# Patient Record
Sex: Female | Born: 1950 | Race: White | Hispanic: No | Smoking: Former smoker
Health system: Southern US, Community
[De-identification: ages and names within clinical notes are randomized; demographics above are authoritative.]

## PROBLEM LIST (undated history)

## (undated) DIAGNOSIS — R7303 Prediabetes: Secondary | ICD-10-CM

## (undated) DIAGNOSIS — M1990 Unspecified osteoarthritis, unspecified site: Secondary | ICD-10-CM

## (undated) DIAGNOSIS — F419 Anxiety disorder, unspecified: Secondary | ICD-10-CM

## (undated) DIAGNOSIS — M329 Systemic lupus erythematosus, unspecified: Secondary | ICD-10-CM

## (undated) DIAGNOSIS — E039 Hypothyroidism, unspecified: Secondary | ICD-10-CM

## (undated) DIAGNOSIS — F32A Depression, unspecified: Secondary | ICD-10-CM

## (undated) HISTORY — DX: Depression, unspecified: F32.A

## (undated) HISTORY — DX: Prediabetes: R73.03

## (undated) HISTORY — PX: TRIGGER FINGER RELEASE: SHX641

## (undated) HISTORY — DX: Systemic lupus erythematosus, unspecified: M32.9

## (undated) HISTORY — DX: Unspecified osteoarthritis, unspecified site: M19.90

## (undated) HISTORY — PX: JOINT REPLACEMENT: SHX530

## (undated) HISTORY — DX: Hypothyroidism, unspecified: E03.9

## (undated) HISTORY — DX: Anxiety disorder, unspecified: F41.9

---
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
## 1982-06-11 HISTORY — PX: OTHER SURGICAL HISTORY: SHX169

## 1986-06-11 HISTORY — PX: DE QUERVAIN'S RELEASE: SHX1439

## 1994-02-20 ENCOUNTER — Ambulatory Visit: Admit: 1994-02-20 | Disposition: A | Discharge status: Home / Self Care | Payer: Self-pay

## 1994-06-28 ENCOUNTER — Ambulatory Visit
Admit: 1994-06-28 | Disposition: A | Discharge status: Home / Self Care | Payer: Self-pay | Admitting: Emergency Medicine

## 1995-01-23 ENCOUNTER — Ambulatory Visit
Admit: 1995-01-23 | Disposition: A | Discharge status: Home / Self Care | Payer: Self-pay | Admitting: Occupational Medicine

## 1995-06-08 ENCOUNTER — Ambulatory Visit
Admit: 1995-06-08 | Disposition: A | Discharge status: Home / Self Care | Payer: Self-pay | Admitting: Occupational Medicine

## 1995-09-04 ENCOUNTER — Ambulatory Visit
Admit: 1995-09-04 | Disposition: A | Discharge status: Home / Self Care | Payer: Self-pay | Admitting: Occupational Medicine

## 1996-02-29 ENCOUNTER — Ambulatory Visit: Admit: 1996-02-29 | Disposition: A | Discharge status: Home / Self Care | Payer: Self-pay

## 1996-10-25 ENCOUNTER — Ambulatory Visit: Admit: 1996-10-25 | Disposition: A | Discharge status: Home / Self Care | Payer: Self-pay | Admitting: Family Medicine

## 2000-02-01 ENCOUNTER — Ambulatory Visit: Admit: 2000-02-01 | Disposition: A | Discharge status: Home / Self Care | Payer: Self-pay

## 2005-06-22 ENCOUNTER — Ambulatory Visit
Admission: RE | Admit: 2005-06-22 | Disposition: A | Discharge status: Home / Self Care | Payer: Self-pay | Source: Ambulatory Visit

## 2009-12-31 ENCOUNTER — Emergency Department
Admission: EM | Admit: 2009-12-31 | Disposition: A | Discharge status: Home / Self Care | Payer: Self-pay | Source: Emergency Department | Admitting: Emergency Medicine

## 2010-06-16 ENCOUNTER — Ambulatory Visit
Admission: AD | Admit: 2010-06-16 | Disposition: A | Discharge status: Home / Self Care | Payer: Self-pay | Source: Ambulatory Visit | Admitting: Orthopaedic Surgery

## 2010-09-11 ENCOUNTER — Inpatient Hospital Stay
Admission: EM | Admit: 2010-09-11 | Disposition: A | Discharge status: Home Health | Payer: Self-pay | Source: Emergency Department

## 2010-09-11 LAB — SEDIMENTATION RATE, AUTOMATED: Sed Rate: 21 (ref 0–30)

## 2010-09-11 LAB — URINALYSIS
Bilirubin, UA: NEGATIVE
Blood, UA: NEGATIVE
Glucose, UA: NEGATIVE
Ketones UA: NEGATIVE
Leukocyte Esterase, UA: NEGATIVE
Nitrate: NEGATIVE
Protein, UR: NEGATIVE
Specific Gravity, UR: 1.011 (ref 1.000–1.035)
Urobilinogen, UA: NORMAL
pH, Urine: 6 (ref 5.0–8.0)

## 2010-09-11 LAB — COMPREHENSIVE METABOLIC PANEL
ALT: 41 U/L (ref 7–56)
AST (SGOT): 36 U/L (ref 5–40)
Albumin, Synovial: 4.2 g/dL (ref 3.9–5.0)
Alkaline Phosphatase: 112 U/L (ref 38–126)
BUN / Creatinine Ratio: 17 (ref 8–20)
BUN: 14 mg/dL (ref 6–20)
Bilirubin, Total: 0.5 mg/dL (ref 0.2–1.3)
CO2: 25 mmol/L (ref 21.0–31.0)
Calcium: 9.2 mg/dL (ref 8.4–10.2)
Chloride: 104 mmol/L (ref 101–111)
Creatinine: 0.82 mg/dL (ref 0.52–1.04)
EGFR: >60 mL/min/{1.73_m2}
EGFR: >60 mL/min/{1.73_m2}
Glucose: 100 mg/dL (ref 70–100)
Potassium: 4.6 mmol/L (ref 3.6–5.0)
Protein, Total: 7.5 g/dL (ref 6.3–8.2)
Sodium: 138 mmol/L (ref 135–145)

## 2010-09-11 LAB — PTT (PARTIAL THROMBOPLASTIN TIME): PTT: 25.9 s (ref 21.5–36.5)

## 2010-09-11 LAB — CBC AND DIFFERENTIAL
BASOPHILS %: 0.6 % (ref 0.0–2.0)
Baso(Absolute): 0.05 10*3/uL (ref 0.00–0.20)
Eosinophils %: 4.3 % (ref 0.0–6.0)
Eosinophils Absolute: 0.34 10*3/uL — ABNORMAL HIGH (ref 0.10–0.30)
Hematocrit: 41.1 % (ref 27.0–49.5)
Hemoglobin: 13.9 g/dL (ref 11.7–15.5)
Immature Granulocytes #: 0.02 10*3/uL (ref 0.00–0.05)
Immature Granulocytes %: 0.3 % — ABNORMAL HIGH (ref 0.0–0.0)
Lymphocytes Absolute: 2.12 10*3/uL (ref 1.00–4.80)
Lymphocytes Relative: 27 % (ref 25.0–55.0)
MCH: 31.8 pg (ref 27.0–34.0)
MCHC: 33.8 g/dL (ref 32.0–36.0)
MCV: 94.1 fL (ref 80–100)
MPV: 9.3 fL (ref 9.0–13.0)
Monocytes Absolute: 0.65 10*3/uL (ref 0.10–1.20)
Monocytes Relative %: 8.3 % — ABNORMAL HIGH (ref 1.0–8.0)
Neutrophils Absolute: 4.7 10*3/uL (ref 1.80–7.70)
Neutrophils Relative %: 59.8 % (ref 49.0–69.0)
Nucleated RBC %: 0 /100WBC (ref 0.0–0.0)
Nucleted RBC #: 0 10*3/uL (ref 0.00–0.00)
Platelets: 285 10*3/uL (ref 150–400)
RBC: 4.37 M/uL (ref 3.80–5.40)
RDW: 12.9 % (ref 11.0–14.0)
WBC: 7.86 10*3/uL (ref 4.80–10.80)

## 2010-09-11 LAB — PT (PROTHROMBIN TIME)
PT INR: 0.9
PT: 11.5 s — ABNORMAL LOW (ref 11.6–14.1)

## 2010-09-11 LAB — C-REACTIVE PROTEIN, QUANTITATIVE: C-Reactive Protein, Quantitative: 1.1 mg/dL — ABNORMAL HIGH (ref 0.0–1.0)

## 2010-09-12 LAB — CBC AND DIFFERENTIAL
BASOPHILS %: 0.5 % (ref 0.0–2.0)
Baso(Absolute): 0.03 10*3/uL (ref 0.00–0.20)
Eosinophils %: 5.1 % (ref 0.0–6.0)
Eosinophils Absolute: 0.31 10*3/uL — ABNORMAL HIGH (ref 0.10–0.30)
Hematocrit: 36.5 % (ref 27.0–49.5)
Hemoglobin: 12 g/dL (ref 11.7–15.5)
Immature Granulocytes #: 0.01 10*3/uL (ref 0.00–0.05)
Immature Granulocytes %: 0.2 % — ABNORMAL HIGH (ref 0.0–0.0)
Lymphocytes Absolute: 2.13 10*3/uL (ref 1.00–4.80)
Lymphocytes Relative: 34.9 % (ref 25.0–55.0)
MCH: 31.5 pg (ref 27.0–34.0)
MCHC: 32.9 g/dL (ref 32.0–36.0)
MCV: 95.8 fL (ref 80–100)
MPV: 8.9 fL — ABNORMAL LOW (ref 9.0–13.0)
Monocytes Absolute: 0.61 10*3/uL (ref 0.10–1.20)
Monocytes Relative %: 10 % — ABNORMAL HIGH (ref 1.0–8.0)
Neutrophils Absolute: 3.03 10*3/uL (ref 1.80–7.70)
Neutrophils Relative %: 49.5 % (ref 49.0–69.0)
Nucleated RBC %: 0 /100WBC (ref 0.0–0.0)
Nucleted RBC #: 0 10*3/uL (ref 0.00–0.00)
Platelets: 231 10*3/uL (ref 150–400)
RBC: 3.81 M/uL (ref 3.80–5.40)
RDW: 13 % (ref 11.0–14.0)
WBC: 6.11 10*3/uL (ref 4.80–10.80)

## 2010-09-12 LAB — COMPREHENSIVE METABOLIC PANEL
ALT: 37 U/L (ref 7–56)
AST (SGOT): 28 U/L (ref 5–40)
Albumin, Synovial: 2.9 g/dL — ABNORMAL LOW (ref 3.9–5.0)
Alkaline Phosphatase: 69 U/L (ref 38–126)
BUN / Creatinine Ratio: 16 (ref 8–20)
BUN: 13 mg/dL (ref 6–20)
Bilirubin, Total: 0.3 mg/dL (ref 0.2–1.3)
CO2: 27 mmol/L (ref 21.0–31.0)
Calcium: 8.2 mg/dL — ABNORMAL LOW (ref 8.4–10.2)
Chloride: 109 mmol/L (ref 101–111)
Creatinine: 0.86 mg/dL (ref 0.52–1.04)
EGFR: >60 mL/min/{1.73_m2}
EGFR: >60 mL/min/{1.73_m2}
Glucose: 93 mg/dL (ref 70–100)
Potassium: 4.8 mmol/L (ref 3.6–5.0)
Protein, Total: 5.6 g/dL — ABNORMAL LOW (ref 6.3–8.2)
Sodium: 140 mmol/L (ref 135–145)

## 2010-09-13 LAB — CBC
Hematocrit: 34.2 % (ref 27.0–49.5)
Hemoglobin: 11.3 g/dL — ABNORMAL LOW (ref 11.7–15.5)
MCH: 31.3 pg (ref 27.0–34.0)
MCHC: 33 g/dL (ref 32.0–36.0)
MCV: 94.7 fL (ref 80–100)
MPV: 9 fL (ref 9.0–13.0)
Nucleated RBC %: 0 /100WBC (ref 0.0–0.0)
Nucleted RBC #: 0 10*3/uL (ref 0.00–0.00)
Platelets: 228 10*3/uL (ref 150–400)
RBC: 3.61 M/uL — ABNORMAL LOW (ref 3.80–5.40)
RDW: 12.8 % (ref 11.0–14.0)
WBC: 5.96 10*3/uL (ref 4.80–10.80)

## 2010-09-13 LAB — VANCOMYCIN TROUGH: Vancomycin Trough: 10.5 ug/mL (ref 10.0–20.0)

## 2010-09-14 LAB — FUNGAL STAIN: Calcofluor White Stain Result: NEGATIVE

## 2010-10-14 LAB — FUNGAL CULTURE AND SMEAR

## 2011-03-14 NOTE — Consults (Signed)
Lori, Arnold                                                    MRN:          [id number]                                                          Account:      [id number]                                                     Document ID:  [id number]                                               Service Date: 09/11/2010                                                                                    MRN: [id number]  Document ID: [id number]  Admit Date: 09/11/2010     Patient Location: [id number]   Patient Type: I     CONSULTING PHYSICIAN: ANTONIO PASTOR MD     REFERRING PHYSICIAN: Earline Mayotte MD        REASON FOR CONSULTATION:  Antibiotic recommendations.     HISTORY OF PRESENT ILLNESS:  This is a very pleasant 60 year old female with a history of depression,  hypothyroidism, who on June 02, 2010 underwent surgery for a trigger  finger in the left hand.  The surgery went uneventfully.  Then about 2  weeks later, she went to see Dr. Allen Norris to remove the stitches, which were  removed, and within 1 day, there was oozing from the wound.  The patient  was taken to surgery on June 16, 2010, for right hand incision and  drainage and found to have a right-hand ring finger infectious  tenosynovitis.  The infection was cleaned out.  She received Rocephin and  vancomycin, and then was sent home on antibiotics, which she took totally  for 20 days and did fine up until a couple of days ago, when she started  noticing discomfort in the ring finger, and the palm of the hand started to  become a little bit tense and swollen.  There is some redness in the palm  of the hand just below the base of the ring finger, this is like a bulging  area.  She has had no symptoms of fever or chills, but discomfort in the  area, and pain and swelling of the right palm into the ring finger.  She  was  given Bactrim.  She went to urgent care for 2 days without improvement.     PAST MEDICAL HISTORY:  Hypothyroidism, _____, trigger finger  surgery.     PAST SURGICAL HISTORY:  Carpal tunnel, history of release of trigger finger of the ring finger of  the right hand.     SOCIAL HISTORY:  Consumes alcohol socially.  Denies tobacco abuse.  She does have a history  of depression.     PHYSICAL EXAMINATION:                                                                                                           Page 1 of 3  Lori, Arnold                                                    MRN:          [id number]                                                          Account:      [id number]                                                     Document ID:  [id number]                                               Service Date: 09/11/2010                                                                                    GENERAL:  She is awake, alert.  VITAL SIGNS:  Temperature 98.2, heart rate is 82, respiratory rate 14,  blood pressure is 163/87.  HEENT:  Head atraumatic, normocephalic.  Eyes:  PERRLA.  Ears:  Normal.   Nose:  Normal.  Throat not congested.  No exudates.  No evidence of thrush.  NECK:  Supple.  Full range of motion.  Carotid pulses are 2+.  No JVD,  nodes or bruits.  LUNGS:  Clear to auscultation.  CARDIOVASCULAR:  Normal S1, S2, regular  rate and rhythm.  No murmurs,  gallops or rubs.  ABDOMEN:  Bowel sounds are present.  Depressible.  Nontender to palpation.   There is no hepatosplenomegaly.  EXTREMITIES:  Pulses are 2+.  The palm of the right hand has edema, and  there is bulging area in the base of the palm going into the ring finger,  it is a little bit erythematous, but I do not see an open wound.  NEUROLOGIC:  Alert and oriented x3.  No signs of focalization.     DIAGNOSTIC STUDIES:  White count is 7.86; hemoglobin 13.9; hematocrit 41.1; platelets of 285.   C-reactive protein is 1.1.  Sodium is 138, potassium 4.6, chloride 104, CO2  25, BUN 14, creatinine 0.82, glucose 100.  Sedimentation rate is 21.  X-ray  of the right hand  reveals no acute bony abnormality.     ASSESSMENT:  This 60 year old female with a history of right trigger finger release in  late December of last year, complicated with an infected tenosynovitis,  treated with I and D and p.o. antibiotics for 20 days, now comes in with  swelling and some redness and pain of the right hand in the palm of the  hand and the base of the right ring finger.  Definitely an infectious  tenosynovitis needs to be ruled out.  My recommendation at this point will  be to continue vancomycin and Zosyn.  I will discuss with Dr. Allen Norris about  doing an MRI.  The patient may require surgery, and if she truly has an  infectious tenosynovitis, my recommendation would be to do IV antibiotics.         Thank you for the consultation.              D:  09/11/2010 10:36 AM by Gerilyn Pilgrim, MD [phone])  T:  09/11/2010 20:23 PM by Elroy Channel: [id number]) (Doc ID: [id number])                                                                                                                 Page 2 of 3  Lori, Arnold                                                    MRN:          [id number]                                                          Account:      [id number]  Document ID:  [id number]                                               Service Date: 09/11/2010                                                                                    cc: Barbera Setters MD                                                                                                           Page 3 of 3  Authenticated by Gerilyn Pilgrim, MD On 09/28/2010 07:58:00 PM

## 2011-03-14 NOTE — Discharge Summary (Signed)
PIERCE, BIAGINI                                                    MRN:          [id number]                                                          Account:      [id number]                                                     Document ID:  [id number]                                                                                                                                   MRN: [id number]  Document ID: [id number]  Admit Date: 09/11/2010  Discharge Date: 09/13/2010     ATTENDING PHYSICIAN:  MOHAMED ALI, MD        DISCHARGE DIAGNOSES:  1.  Right index finger abscess/cellulitis status post incision and  drainage.  2.  History of right index finger infectious tenosynovitis.  3.  History of hypothyroidism.  4.  History of depression.     CONSULTATIONS:  Dr. Allen Norris, orthopedic surgery.    Dr. Gerilyn Pilgrim, infectious diseases.     HOSPITAL COURSE:  This is a 60 year old woman with a recent history of a right index finger  trigger finger, status post incision and drainage for infectious  tenosynovitis in January 2012, and subsequently the infection was cleared.   She received Rocephin and vancomycin, and was sent home on antibiotics.   She took the antibiotics for 20 days and did fine until 2 weeks ago when  she started noticing discomfort in the right finger and the palm of the  hand started to become tense and swollen, and redness in the palm continued  to worsen.  However, she denied fever or chills.  Therefore, the patient  came to the emergency room for evaluation of the right finger and the palm  swelling and redness.  Before coming to the emergency room, 2 days ago the  patient went to urgent care center and was prescribed Bactrim.  However,  with no improvement in the right hand redness or swelling.  Orthopedic  consultation was obtained and per Dr. Allen Norris to continue with antibiotics  if agreed by infectious diseases consultation.  Infectious diseases  consultation was obtained and  recommendations were to  start on vancomycin  and Zosyn, and MRI of the hand as well.  MRI of the right hand showed focal  rim enhancing fluid collection along the ulnar aspect of the ring finger  flexor tendon at the level of the metatarsophalangeal joint that appeared  contiguous with flexor tendon sheath, likely representing an abscess or  focal infectious tenosynovitis with dilatation of the tendon sheath at the  site of prior surgical operation.  Given in information from MRI,  orthopedic consultation was obtained and  Dr. Allen Norris recommended incision  and drainage of the right hand for abscess.  The patient consented to the  surgical operation.  The patient underwent the incision and drainage of the  right hand for abscess drainage.  The patient tolerated the procedure well,                                                                                                           Page 1 of 2  JOZEY, JANCO                                                    MRN:          [id number]                                                          Account:      [id number]                                                     Document ID:  [id number]                                                                                                                                   according to the operative note.  Vancomycin and Zosyn was continued.  Per  infectious diseases consultation, the patient can be discharged to home  with IV antibiotics at home to be arranged by case  management to follow up  with Dr. Gerilyn Pilgrim in 10 days, the infectious diseases clinic, and  also to follow up with Dr. Allen Norris one day after the discharge on September 14, 2010, at the orthopedic surgery clinic.  The patient agrees for followup  with infectious disease clinic and orthopedic surgery clinic. The patient  agrees for administration of IV antibiotics with the help of health, home  health aide.  Currently, the patient is afebrile.  hemodynamically stable  and  is ready for discharge.     CONDITION UPON DISCHARGE:  Hemodynamically stable.     DISPOSITION:  Discharged to home with the arrangement for IV antibiotic infusion at home.     DISCHARGE MEDICATIONS:  Please see medical reconciliation form for discharge medications.     FOLLOWUP INSTRUCTIONS:  Follow up with Dr. Allen Norris on September 14, 2010, in orthopedic surgery clinic.      Follow up with Dr. Gerilyn Pilgrim in 10 days and the infectious diseases  clinic.    Follow up with primary care physician in 1 week.       Time spent to discharge the patient is 33 minutes.              _______________________________     Date/Time Signed: _____________  Judson Roch MD ([id number])     D:  09/13/2010 14:42 PM by Dr. Judson Roch, MD ([id number])  T:  09/14/2010 15:04 PM by Lynnell Grain      [name]: [id number]) (Doc ID: [id number])           cc: Barbera Setters MD   Zoe Lan PASTOR MD                                                                                                           Page 2 of 2  Authenticated by Judson Roch, MD On 09/20/2010 07:18:35 AM

## 2011-03-14 NOTE — H&P (Signed)
Lori Arnold, Lori Arnold                                                    MRN:          [id number]                                                          Account:      [id number]                                                     Document ID:  [id number]                                                                                                                                   MRN: [id number]  Document ID: [id number]  Admit Date: 09/11/2010     Patient Location: [id number]   Patient Type: I     ATTENDING PHYSICIAN: MOHAMED ALI, MD        CHIEF COMPLAINT:  Right hand pain.     HISTORY OF PRESENT ILLNESS:  This is a 60 year old female who developed right ring finger infectious  flexor tenosynovitis after she had surgery for trigger finger on June 02, 2010.  She underwent incision and drainage on January 6 for the  treatment of the infectious tenosynovitis.  The patient was on antibiotics  for 3 weeks, after which she felt better and she was asymptomatic for the  last 2 months.  Currently, she presents to the emergency room for  evaluation of progressive swelling, erythema, and tenderness that started  under ring finger and spread to her hand, leading to restricted joint  movement and malfunction.  She denied any fever, malaise, or weakness.  No  recent trauma to the hand and no discharge.  The orthopedic surgeon  on-call, Dr. Kellie Shropshire, was contacted and he recommended calling the orthopedic  surgeon who performed the procedure, Dr. Allen Norris.  Also, he recommended no  antibiotics until an infectious disease specialist  was consulted.  ID  recommended starting the patient on vancomycin, and Zosyn pending  orthopedic surgeon's evaluation.     PAST MEDICAL HISTORY:  Hypothyroidism.     PAST SURGICAL HISTORY  Trigger finger incision and drainage for tenosynovitis.     PAST PSYCHIATRIC HISTORY:  Depression.     SOCIAL HISTORY:  Consumes alcohol socially.  Denies tobacco and drug abuse.  ALLERGIES:  No known drug  allergies.                                                                                                              Page 1 of 3  Lori, Arnold                                                    MRN:          [id number]                                                          Account:      [id number]                                                     Document ID:  [id number]                                                                                                                                   CURRENT MEDICATIONS:  Ibuprofen 800 mg orally q.8 h., Wellbutrin 50 mg orally daily,  levothyroxine, and Cymbalta.     PHYSICAL EXAMINATION:  VITAL SIGNS:  On presentation, blood pressure 166/83, heart rate 84,  respiratory rate 18, temperature 98.2, oxygen saturation 99% on room air.  GENERAL:  She is a middle-aged female, alert, oriented, not in respiratory  distress or pain.  HEENT:  Not pale, not icteric, not cyanosed.  Moist mucous membranes.  NECK:  No JVD, no carotid bruit.  CARDIOVASCULAR:  S1, S2 are normal.  No murmurs or added sounds.  CHEST:  Clear to auscultation bilaterally.  ABDOMEN:  Soft, nontender, nondistended.  EXTREMITIES:  No edema, no rash on right.  She has right fingers swelling,  erythema associated with palmar swelling and erythema, more pronounced at  the metacarpophalangeal area.  Old surgical scar, no evidence of discharge.   Supratrochlear tenderness.     DIAGNOSTIC STUDIES:  Hand  x-ray:  No acute bony abnormality.  White blood cell 7.8, hemoglobin  13.9, hematocrit 41.1, platelets 285.  BUN 14, sodium 138, potassium 4.6,  chloride 104, CO2 25, glucose 100, creatinine 0.8, calcium 9.2, bilirubin  0.5, protein 7.5.  C-reactive protein 1.1.     ASSESSMENT AND PLAN:  1.  Cellulitis/abscess with hand space infection.  2.  History of right ring finger infectious flexor tenosynovitis, status  post incision and drainage on June 16, 2010.  Subsequently, she was on  antibiotics for 3  weeks.   3.  History of trigger finger surgery on June 02, 2010.  4.  Hypothyroidism.  5.  Depression.     This is a 60 year old female with the above-mentioned history who presents  to the emergency room for evaluation of worsening of finger swelling,  tenderness, erythema that spread to the hand and led to restriction of  movement and malfunctioning.  She has no constitutional symptoms.  No  fever, no leukocytosis.  X-ray of the hand showed no acute abnormality.   ESR is normal and C-reactive protein at upper limit of normal.  ID  recommended starting the patient on intravenous vancomycin and Zosyn.  We  will follow up with orthopedic surgeon Dr. Vista Deck recommendations  regarding surgical intervention.                                                                                                                 Page 2 of 3  ZIYANNA, TOLIN                                                    MRN:          [id number]                                                          Account:      [id number]                                                     Document ID:  [id number]  _______________________________     Date/Time Signed: _____________  Cordie Grice MD ([id number])     D:  09/11/2010 08:17 AM by Dr. Cordie Grice, MD ([id number])  T:  09/11/2010 15:20 PM by MD      Lori Arnold: [id number]) (Doc ID: [id number])        cc:                                                                                                            Page 3 of 3  Authenticated by Cordie Grice, MD On 09/20/2010 06:40:03 AM

## 2011-03-14 NOTE — Op Note (Signed)
Lori Arnold, Lori Arnold                                                    MRN:          [id number]                                                          Account:      [id number]                                                     Document ID:  [id number]                                               Procedure Date: 09/12/2010                                                                                    MRN: [id number]  Document ID: [id number]  Admit Date: 09/11/2010     Patient Location: DISCHARGED 09/13/2010  Patient Type: I     SURGEON: Nyoka Lint MD  ASSISTANT:          PREOPERATIVE DIAGNOSES:  1.  Right hand infection with localized infectious flexor tenosynovitis.  2.  Right hand cellulitis.     POSTOPERATIVE DIAGNOSES:  1.  Right hand infection with localized infectious flexor tenosynovitis.    2.  Right hand cellulitis.     TITLE OF PROCEDURE:  Incision and drainage of right hand infection.     ANESTHESIA:  General.     ESTIMATED BLOOD LOSS:  Minimal.     TOURNIQUET:  250 mmHg.     COMPLICATIONS:  None.     STATEMENT OF MEDICAL NECESSITY:  The patient is a 60 year old female who had a tendon sheath release of the  hand months ago and had some infection postoperatively, but that cleared  with incision and drainage and oral antibiotic, and now she is having a  recurrent problem.  She presented to the emergency room and an MRI was  obtained and revealed local area of abscess and she is taken to the  operating room for incision and drainage of the abscess and admission for  IV antibiotics.  Infectious disease consult has been obtained.     DESCRIPTION OF PROCEDURE:  The patient was taken to the operating room and following the  Page 1 of 2  Lori Arnold, Lori Arnold                                                    MRN:          [id number]                                                          Account:       [id number]                                                     Document ID:  [id number]                                               Procedure Date: 09/12/2010                                                                                    administration of general anesthesia, the right upper extremity was prepped  and draped in the usual sterile fashion.  The patient was already on IV  vancomycin and Zosyn.  Following Esmarch exsanguination, the tourniquet was  raised to a pressure of 250 mmHg.  There was a localized area of fluctuance  noted at the base of the ring finger and a transverse incision using  previous old scar was made.  The incision was carried down through the skin  and subcutaneous tissue and immediately revealed an area of pus and  cultures for aerobic, anaerobic, fungus and Gram stain were obtained.  The  wound was inspected and there was a very localized area of abscess.  I was  not able to milk anything from the volar flexor tendon sheath more  proximally or distally.  There was no swelling of the mid palm or of the  finger starting at the level of the proximal interphalangeal joint.  The  patient already had been on antibiotics for 24 hours and the erythema had  essentially resolved except for the very localized area where the incision  was made.  The flexor tendon was visualized and noted to be intact.  The  wound was then copiously irrigated with an 18-gauge Angiocath and  approximately 1 liter of fluid was used for ear local irrigation.   Following this, a Betadine-soaked wick was placed in the wound and the  wound had a couple of sutures placed, but still left open with the wick.  A  sterile dressing was applied and the right hand was placed into a large  bulky dressing with the hand in the position of function and a fiberglass  splint applied to the dorsal aspect of the hand.  Neurovascular status  remained intact in the right hand and specifically the ring finger.   The  tourniquet was released and there was good capillary refill of all fingers.   Of note, there was no area of necrosis of the hand or ring finger noted at  the time of the procedure.  Sponge, needle, and instrument counts were  reported to me as correct.  The patient was transferred to the recovery  room in stable condition.              D:  09/15/2010 14:06 PM by Nyoka Lint, MD [phone])  T:  09/15/2010 16:01 PM by Gwenyth Bender      [name]: [id number]) (Doc ID: [id number])        cc:                                                                                                            Page 2 of 2  Authenticated by Barbera Setters, MD On 09/29/2010 02:35:24 PM

## 2011-03-14 NOTE — Op Note (Signed)
Lori Arnold, Lori Arnold                                                    MRN:          [id number]                                                          Account:      [id number]                                                     Document ID:  [id number]                                               Procedure Date: 06/16/2010                                                                                    MRN: [id number]  Document ID: [id number]  Admit Date: 06/16/2010     Patient Location: DISCHARGED 06/16/2010  Patient Type: O     SURGEON: Nyoka Lint MD  ASSISTANT:          PREOPERATIVE DIAGNOSIS:  Right hand ring finger infectious flexor tenosynovitis.     POSTOPERATIVE DIAGNOSIS:  Right hand ring finger infectious flexor tenosynovitis.     TITLE OF PROCEDURE:  Right hand incision and drainage of flexor tendon sheath.     ANESTHESIA:  General.     ESTIMATED BLOOD LOSS:  2 mL.     COMPLICATIONS:  None.     STATEMENT OF MEDICAL NECESSITY:  The patient is a 60 year old woman who underwent a right hand trigger  finger tendon sheath release about 2 weeks ago.  She did great following  the procedure and had no problem, but then got the hand wet and had  exposure to chicken juice on the hand.  She presented to the office with  swelling, drainage, and erythema at the level of the incision and then had  swelling and erythema of the ring finger with the swelling extending to the  level of the proximal interphalangeal joint with tenderness over the flexor  tendon sheath as well as pain with passive extension of the finger.  A  decision was made to take her to the operating room for irrigation and  debridement with surgery as needed to address infection.  Proper informed  consent was obtained.     DESCRIPTION OF PROCEDURE:  The patient was taken to the operating room and following the  administration of general anesthesia,  the right hand was prepped and draped                                                                                                            Page 1 of 2  Lori Arnold, Lori Arnold                                                    MRN:          [id number]                                                          Account:      [id number]                                                     Document ID:  [id number]                                               Procedure Date: 06/16/2010                                                                                    in the usual sterile fashion.  A tourniquet was placed at the right upper  arm.  Following sterile prep and drape of the hand, cultures were taken at  the level of the drainage and sent for Gram stain, aerobic and anaerobic.   There was minimal gross pus purulent drainage revealed.  The incision was  extended in order to irrigate.  An 18-gauge Angiocath was used to irrigate  at the level of the gapped open incision where there was purulent drainage  and minimal drainage expressed.  The wound was copiously irrigated.   Following this, a wick was placed in the wound.  The extended incision was  closed on either side with a simple 3-0 nylon suture, but otherwise, the  wound was left open with the wick to allow for drainage.  There was some  warmth and erythema at the level of the previous incision site that  extended to the level of the  proximal interphalangeal joint, but there was  no swelling, warmth, or erythema beyond that.  This appeared to be more  localized infection.  A sterile bulky dressing was applied with 4 x 4's and  Webril as well as an Ace wrap.  The neurovascular status remained intact in  the ring finger with good capillary refill.  Care was taken to protect the  digital neurovascular structure at the time of the procedure.  In addition,  the flexure tendon structures were also protected.  The flexor tendon  sheath incision from previously was extended to assess for any drainage  within the flexor tendon and there did not appear to be any, but  rather  more localized infection.  Again, the wound was copiously irrigated and  then the wound closed as dictated above.  The patient did receive 2 gm of  Rocephin and 1 gm of vancomycin following taking the cultures.  Sponge,  needle and instrument counts were reported to me as correct.  The patient  was transferred to the recovery room in stable condition.              D:  06/18/2010 12:10 PM by Nyoka Lint, MD ([id number])  T:  06/19/2010 16:07 PM by Gwenyth Bender      [name]: [id number]) (Doc ID: [id number])        cc:                                                                                                            Page 2 of 2  Authenticated by Barbera Setters, MD On 06/21/2010 03:34:46 PM

## 2012-03-07 LAB — ECG 12-LEAD
Atrial Rate: 77 {beats}/min
P Axis: 77 degrees
P-R Interval: 156 ms
Q-T Interval: 412 ms
QRS Duration: 80 ms
QTC Calculation (Bezet): 466 ms
R Axis: 41 degrees
T Axis: 63 degrees
Ventricular Rate: 77 {beats}/min

## 2013-01-25 ENCOUNTER — Emergency Department: Payer: No Typology Code available for payment source

## 2013-01-25 ENCOUNTER — Emergency Department
Admission: EM | Admit: 2013-01-25 | Discharge: 2013-01-25 | Disposition: A | Discharge status: Home / Self Care | Payer: No Typology Code available for payment source | Attending: Emergency Medicine | Admitting: Emergency Medicine

## 2013-01-25 DIAGNOSIS — W208XXA Other cause of strike by thrown, projected or falling object, initial encounter: Secondary | ICD-10-CM | POA: Insufficient documentation

## 2013-01-25 DIAGNOSIS — S9031XA Contusion of right foot, initial encounter: Secondary | ICD-10-CM

## 2013-01-25 DIAGNOSIS — S92911A Unspecified fracture of right toe(s), initial encounter for closed fracture: Secondary | ICD-10-CM

## 2013-01-25 DIAGNOSIS — S92919A Unspecified fracture of unspecified toe(s), initial encounter for closed fracture: Secondary | ICD-10-CM | POA: Insufficient documentation

## 2013-01-25 MED ORDER — HYDROCODONE-ACETAMINOPHEN 5-325 MG PO TABS
1.0000 | ORAL_TABLET | Freq: Four times a day (QID) | ORAL | Status: DC | PRN
Start: 2013-01-25 — End: 2014-12-31

## 2013-01-25 NOTE — Discharge Instructions (Signed)
Please rest, ice and elevate the area. Please follow-up. Return to the ER for any concerns.     Phalanx Fracture, Toe    You have been diagnosed with a fracture of your toe.    Your big toe has 2 bones. The other toes each have 3 bones.    A fracture is a break in a bone. It means the same thing as saying a "broken bone." In general fractures heal over about 6-8 weeks. The broken bone will eventually become stronger at the site of the break than in the surrounding area. Most toe fractures can be managed with a dynamic splint (buddy taping). In a few poorly aligned fractures, especially involving the big toe, a cast or even surgery may be required.    Caring for a fracture usually involves medication to reduce pain, the use of a splint or cast to reduce movement, and Resting, Icing, Compressing and Elevating the injured area. Remember this as "RICE."   REST : Limit the use of the injured body part.   ICE: By applying ice to the affected area, swelling and pain can be reduced. Place some ice cubes in a re-sealable (Ziploc) bag and add some water. Put a thin washcloth between the bag and the skin. Apply the ice bag to the area for at least 20 minutes. Do this at least 4 times per day. Using the ice for longer times and more frequently is OK. NEVER APPLY ICE DIRECTLY TO THE SKIN.   COMPRESS: Compression means to apply pressure around the injured area such as with a splint, cast or an ace bandage. Compression decreases swelling and improves comfort. Compression should be tight enough to relieve swelling but not so tight as to decrease circulation. Increasing pain, numbness, tingling, or change in skin color, are all signs of decreased circulation.   ELEVATE: Elevate the injured part. For example, a leg can be elevated by placing the leg on a chair while sitting and propped up on pillows while lying down.    You have been given a SPLINT for your fracture to decrease pain and help to keep the injured area  immobilized. You should use the splint until you follow-up with the referral orthopedic (bone) doctor.    Use the following SPLINT CARE instructions and do the following frequently throughout the day:   Check capillary refill (circulation) in the nail beds by pressing on the nail bed and then releasing the nail bed. It should turn white when you press on it and then promptly return to pink in less than 2 seconds after you let go.   Watch for swelling of the area beyond the splint.   If the skin of the foot or toes is excessively cold, pale or numb to the touch, the splint may be too tight. The wrap holding the splint in place can be loosened, or you can return here or go to the nearest Emergency Department to have it adjusted.    YOU SHOULD SEEK MEDICAL ATTENTION IMMEDIATELY, EITHER HERE OR AT THE NEAREST EMERGENCY DEPARTMENT, IF ANY OF THE FOLLOWING OCCURS:   You experience a severe increase in pain or swelling in the affected area.   You develop new numbness and tingling in or below the affected area.   You develop a cold, pale toe that appears to have a problem with its blood supply.      Contusion    You have been diagnosed with a contusion.    A contusion is  a bruise. A contusion occurs when something strikes or hits the body. This breaks small blood vessels called capillaries. When the capillaries break, blood leaks out. This makes the skin look red, purple, blue, or black. The injured area may hurt for a few days. If you take a blood thinner (like Coumadin or warfarin) the bruising may be worse.    Apply ice to the bruise. Avoid using the injured body part.    Apply ice to help with pain and swelling. Put some ice cubes in a re-sealable plastic bag (like Ziploc). Add some water. Seal the bag. Put a thin washcloth between the bag and the skin. Apply the ice bag for at least 20 minutes. Do this at least 4 times per day. It's okay to apply ice longer or more often. NEVER APPLY ICE DIRECTLY TO THE  SKIN. Always keep a washcloth between the ice pack and your body.    YOU SHOULD SEEK MEDICAL ATTENTION IMMEDIATELY, EITHER HERE OR AT THE NEAREST EMERGENCY DEPARTMENT, IF ANY OF THE FOLLOWING OCCURS:   Your pain or swelling gets much worse.   You develop new numbness or tingling in or below the affected area.   Your foot or hand looks cold or pale. This could mean there is a problem with circulation (blood supply).

## 2013-01-25 NOTE — ED Notes (Signed)
Pt injured right foot moving mattress. pts 5th toe swollen. Pt able to bear weight

## 2013-01-25 NOTE — ED Provider Notes (Signed)
Physician/Midlevel provider first contact with patient: 01/25/13 1433         History     Chief Complaint   Patient presents with   . Foot Injury     HPI Comments: 62 YOF presents to the ED c/o right foot pain, especially 4th and 5th toe. Pt reports about 12 pm today, she lifted a king size air mattress to clean under when it fell onto her foot. Pt rates the pain @ 5/10 with no radiation. Pt c/o associated pain with movement and ambulation. No numbness or tingling. Pt drove herself her. Pt reports she took vicodin that she has at home for arthritis with improvement. No other injuries.     Patient is a 62 y.o. female presenting with foot injury. The history is provided by the patient. No language interpreter was used.   Foot Injury   Incident onset: about 12 pm. The incident occurred at home. The injury mechanism was a direct blow. The pain is present in the right toes and right foot. The quality of the pain is described as aching and throbbing. The pain is at a severity of 5/10. The pain is mild. The pain has been constant since onset. Associated symptoms include loss of sensation. Pertinent negatives include no numbness, no inability to bear weight, no loss of motion, no muscle weakness and no tingling. She reports no foreign bodies present. The symptoms are aggravated by activity, palpation and bearing weight. Treatments tried: vicodin. The treatment provided significant relief.       Past Medical History   Diagnosis Date   . Arthritis    . Lupus        Past Surgical History   Procedure Date   . Finger surgery        History reviewed. No pertinent family history.    Social-lives with others   History   Substance Use Topics   . Smoking status: Not on file   . Smokeless tobacco: Not on file   . Alcohol Use: Yes      Comment: 1-2 drinks a night       .     No Known Allergies    Current/Home Medications    DICLOFENAC (VOLTAREN) 75 MG EC TABLET    Take 75 mg by mouth 2 (two) times daily.    ERGOCALCIFEROL  (ERGOCALCIFEROL) 50000 UNITS CAPSULE    Take 50,000 Units by mouth once a week.    FESOTERODINE FUMARATE (TOVIAZ PO)    Take by mouth.    FOLIC ACID (FOLVITE) 400 MCG TABLET    Take 400 mcg by mouth daily.    HYDROXYCHLOROQUINE (PLAQUENIL) 200 MG TABLET    Take by mouth daily.    LEVOTHYROXINE (SYNTHROID, LEVOTHROID) 50 MCG TABLET    Take 50 mcg by mouth.    VITAMIN D, CHOLECALCIFEROL, 400 UNITS CHEW TAB    Chew by mouth.        Review of Systems   HENT: Negative for neck pain and neck stiffness.    Eyes: Negative for discharge and redness.   Musculoskeletal: Positive for arthralgias. Negative for back pain, joint swelling and gait problem.   Skin: Negative for color change, rash and wound.   Neurological: Negative for tingling, weakness and numbness.   Hematological: Does not bruise/bleed easily.   Psychiatric/Behavioral: Negative for self-injury. The patient is not nervous/anxious.        Physical Exam    BP 150/80  Pulse 74  Temp 98.6 F (37  C)  Resp 16  Ht 1.651 m  Wt 86.183 kg  BMI 31.62 kg/m2  SpO2 99%    Physical Exam   Nursing note and vitals reviewed.  Constitutional: She is oriented to person, place, and time. She appears well-developed and well-nourished. No distress.        Pt resting comfortably in NAD   HENT:   Head: Normocephalic and atraumatic.   Right Ear: External ear normal.   Left Ear: External ear normal.   Nose: Nose normal.   Eyes: Conjunctivae normal are normal. Pupils are equal, round, and reactive to light. Right eye exhibits no discharge. Left eye exhibits no discharge. No scleral icterus.   Neck: Normal range of motion. Neck supple. No JVD present. No tracheal deviation present.   Cardiovascular: Normal rate and regular rhythm.    Pulmonary/Chest: Effort normal and breath sounds normal. No respiratory distress.   Musculoskeletal: Normal range of motion. She exhibits tenderness. She exhibits no edema.        Right foot: She exhibits tenderness and bony tenderness. She exhibits  normal range of motion, no swelling, normal capillary refill, no crepitus, no deformity and no laceration.        Pt with ttp to right 4th and 5th toe and lateral aspect of foot, pt with full rom with pain, good pms.    Neurological: She is alert and oriented to person, place, and time.   Skin: Skin is warm and dry. No rash noted. She is not diaphoretic.   Psychiatric: She has a normal mood and affect. Her behavior is normal. Judgment and thought content normal.       MDM and ED Course     ED Medication Orders     None           MDM  Number of Diagnoses or Management Options  Foot contusion, right, initial encounter:   Toe fracture, right, closed, initial encounter:   Diagnosis management comments: I, Langley Gauss, PA-C, have been the primary provider for Lori Arnold during this Emergency Dept visit. The attending signature signifies review and agreement of the history, physical exam, evaluation, clinical impression and plan except as noted.   I have reviewed the nursing notes, including Past medical and surgical,Family and Social History     Oxygen Saturation by Pulse Oximetry  is 95%-100% - normal, no interventions needed     DDX: foot contusion, foot fracture, toe fracture, toe contusion, sprain    Xray reviewed by me : right 5th toe proximal phalanx fracture     Discussed with patient xray results. Need for rest, avoid aggravating activities, ice and elevation and follow-up. Return to the ER for any concerns. Pt voices understanding. No questions.        Amount and/or Complexity of Data Reviewed  Tests in the radiology section of CPT: ordered and reviewed  Discuss the patient with other providers: yes  Independent visualization of images, tracings, or specimens: yes    Risk of Complications, Morbidity, and/or Mortality  Presenting problems: moderate  Management options: moderate    Patient Progress  Patient progress: stable        Procedures  Radiology Results (24 Hour)     Procedure Component Value  Units Date/Time    Foot Right AP Lateral And Oblique [[id number]] Collected:01/25/13 1514    Order Status:Completed  Updated:01/25/13 1519    Narrative:    History: Pain and injury.    FINDINGS: 3 AP, lateral, and oblique views were  obtained.    There is nondisplaced fracture through the distal metaphysis of the  right fifth proximal phalanx. No other fractures are seen. The joint  spaces are intact.      Impression:      Nondisplaced fracture of the right fifth proximal phalanx.    Lanier Ensign, MD   01/25/2013 3:15 PM          Clinical Impression & Disposition     Clinical Impression  Final diagnoses:   Toe fracture, right, closed, initial encounter   Foot contusion, right, initial encounter        ED Disposition     Discharge Lori Arnold discharge to home/self care.    Condition at discharge: Stable             New Prescriptions    HYDROCODONE-ACETAMINOPHEN (NORCO) 5-325 MG PER TABLET    Take 1 tablet by mouth every 6 (six) hours as needed for Pain.               Concaugh-Gruendel, Faylene Kurtz, [place]  01/26/13 0040

## 2013-01-26 NOTE — ED Provider Notes (Signed)
ATTENDING : Safire Gordin, MD.  Review of MLP chart- I have reviewed the history, PE, evaluation, clinical impression, plan and agree.              Harman Langhans, MD  01/26/13 1821

## 2013-06-11 HISTORY — PX: COLONOSCOPY: SHX174

## 2014-10-20 ENCOUNTER — Telehealth (INDEPENDENT_AMBULATORY_CARE_PROVIDER_SITE_OTHER): Payer: Self-pay

## 2014-10-20 DIAGNOSIS — E039 Hypothyroidism, unspecified: Secondary | ICD-10-CM

## 2014-10-20 NOTE — Telephone Encounter (Signed)
Spoke to pt regarding lab results from 09/07/14. She was given provider's comments/recommendations and she verbalized understanding. Per your order on [facility], rx for synthroid to [facility] pharmacy was called in (spoke to [employer], pharmacist), rx typed as "Synthroid 150 mcg one PO daily #30 RF x5." Also, if you could please put in future bloodwork orders for TSH, FT4, TT3, FT3. Pt aware that she needs to make appt for bloodwork in 6 wks.

## 2014-10-21 NOTE — Telephone Encounter (Signed)
Future labs ordered in Epic

## 2014-11-19 ENCOUNTER — Encounter (INDEPENDENT_AMBULATORY_CARE_PROVIDER_SITE_OTHER): Payer: Self-pay | Admitting: Internal Medicine

## 2014-12-05 ENCOUNTER — Other Ambulatory Visit (INDEPENDENT_AMBULATORY_CARE_PROVIDER_SITE_OTHER): Payer: Self-pay | Admitting: Internal Medicine

## 2014-12-09 ENCOUNTER — Ambulatory Visit: Payer: No Typology Code available for payment source

## 2014-12-09 DIAGNOSIS — Z01818 Encounter for other preprocedural examination: Secondary | ICD-10-CM

## 2014-12-09 NOTE — Pre-Procedure Instructions (Addendum)
PATIENT WILL TAKE BUPROPRION, PLACQUENIL, PREDNISONE AND BOTH THYROID MEDS AND ARRIVE 1300 FOR 1430 SURGERY ON 12/30/2014. SHE WILL HAVE FBS ON ARRIVAL WILL STOP METFORMIN 48 HRS PRIOR  KNOWS TO STOP NSAIDS 1 WEEK PRIOR. WILL HAVE PREOP EVAL W/ PCP SOON. AND HAS APPT FOR CBC & T&S 7/19.

## 2014-12-16 ENCOUNTER — Ambulatory Visit (INDEPENDENT_AMBULATORY_CARE_PROVIDER_SITE_OTHER): Payer: PRIVATE HEALTH INSURANCE | Admitting: Cardiovascular Disease

## 2014-12-16 ENCOUNTER — Encounter (INDEPENDENT_AMBULATORY_CARE_PROVIDER_SITE_OTHER): Payer: Self-pay | Admitting: Cardiovascular Disease

## 2014-12-16 VITALS — BP 158/84 | HR 96 | Temp 98.4°F | Resp 16 | Ht 64.0 in | Wt 206.0 lb

## 2014-12-16 DIAGNOSIS — N8500 Endometrial hyperplasia, unspecified: Secondary | ICD-10-CM

## 2014-12-16 DIAGNOSIS — M329 Systemic lupus erythematosus, unspecified: Secondary | ICD-10-CM

## 2014-12-16 DIAGNOSIS — Z01818 Encounter for other preprocedural examination: Secondary | ICD-10-CM

## 2014-12-16 DIAGNOSIS — Z01810 Encounter for preprocedural cardiovascular examination: Secondary | ICD-10-CM

## 2014-12-16 DIAGNOSIS — E039 Hypothyroidism, unspecified: Secondary | ICD-10-CM

## 2014-12-16 NOTE — Progress Notes (Signed)
Subjective:      Date: 12/16/2014 5:30 PM   Patient ID: Lori Arnold is a 64 y.o. female.    Chief Complaint:  Chief Complaint   Patient presents with   . Pre-op Exam       HPI:  Visit Type: Pre-operative Evaluation  Procedure: hysterectomy  Date of Surgery: 12/30/2014  Surgeon: dr Tristan Schroeder  Fax Number (Required): [phone]  Chief Complaint:  Pre op  Recent Health (admits): urinary frequency and abdominal pain  Recent Health (denies): fever, fatigue, chest pain, cough, nausea, vomiting, diarrhea, dyspnea, easy bruising, LE swelling and poor exercise tolerance  Exercise Tolerance:   Surgical Risk Factors: No active cardiac or  pulmonary conditions  Prior Anesthesia:no    64 year old woman who presents for a pre-operative assessment prior to a robotic assisted orthoscopic hysterectomy at [facility] by Dr. Tristan Schroeder on December 30, 2014.  This surgery is indicated because of recent findings of abnormally thickened "uterine lining," which was recently biopsied. The patient said that she had a 10-day episode of light vaginal bleeding and this prompted the recent biopsy.   The pathology report described benign Tissue, but her gynecologist is concerned that the patient is still at risk for possible malignancy. Hence, the surgery is being done out of an abundance of caution.  She has not had any more vaginal bleeding.    Her activity level is somewhat limited because of recent SLE activity that causes joint discomfort. She could climb 3-4 flights of steps without difficulty.  She walks her 3 labrador retrievers daily without difficulty.  No exertional chest pain and no dyspnea on exertion.     Problem List:  There is no problem list on file for this patient.    ROS:  Constitutional: Negative for fever, chills, weight loss. Chronic malaise due to SLE.  HENT: Negative for congestion, ear discharge, ear pain, hearing loss, sore throat.    Eyes: Negative for blurred vision, double vision.   Respiratory: Negative for cough,  sputum production, shortness of breath.    Cardiovascular: Negative for chest pain, palpitations.   Gastrointestinal: Negative for heartburn, nausea, vomiting, abdominal pain.   Genitourinary: Negative for dysuria, urgency. + Chronic overactive bladder with frequency.   Musculoskeletal: Chronic  joint pain due to SLE.   Skin: Negative for itching and rash.    Neurological: Negative for dizziness, lightheadedness, weakness and headaches.           Objective:       Physical Exam:  Constitutional: Well-developed and well-nourished. Pt is active.   HENT: atraumatic, normacephalic.   Mouth/Throat: Mucous membranes are moist. Dentition is normal. Oropharynx is clear.   Eyes: Conjunctivae and EOM are normal. Pupils are equal, round, and reactive to light.   Neck: Normal range of motion. Neck supple. No carotid bruits.  Cardiovascular: Normal rate, regular rhythm, no S3, normal S1, normal S2, no S4. No murmur heard. Point of maximal intensity is in the left mid-clavicular line at T4.   Pulmonary/Chest: Breath sounds normal. No wheezing, rhonchi, crackles, cough.   Abdominal: Bowel sounds are normal. Soft, non-tender, non-distended. No palpable masses.   Musculoskeletal: Normal range of motion.    Neurological: Alert, awake, and oriented to person, place, and time. Normal strength.   Extremities: Normal distal pulses, no clubbing, no cyanosis. Mild pitting edema in bilateral lower extremities.  Skin: Skin is warm and moist. No rash.        Assessment/Plan:       1.  Preoperative testing  - Type and Screen  - CBC and differential    2. Endometrial hyperplasia    3. Hypothyroidism, unspecified hypothyroidism type    4. Lupus (systemic lupus erythematosus)    5. Primary hypothyroidism  - T4, free  - T3, free  - TSH    The patient is at low risk for a perioperative medical complications.  She may proceed with her gynecologic surgery as per Dr. Tristan Schroeder.  She will return to the office in approximately 6 weeks for a follow up visit  with Dr. Debbra Riding.       Lori Beecham June Nedra Hai, DO          Current Medications:  Current Outpatient Prescriptions   Medication Sig Dispense Refill   . buPROPion XL (WELLBUTRIN XL) 300 MG 24 hr tablet Take 300 mg by mouth every morning.     . cetirizine (ZYRTEC) 10 MG chewable tablet Chew 10 mg by mouth daily.     . Cholecalciferol (VITAMIN D3) 5000 UNITS Tab Take by mouth.     Marland Kitchen DHEA 25 MG Cap Take by mouth.     . estradiol (VIVELLE-DOT) 0.0375 MG/24HR Place 1 patch onto the skin twice a week.     Marland Kitchen Fesoterodine Fumarate (TOVIAZ PO) Take 4 mg by mouth every morning.        . fluticasone (FLONASE) 50 MCG/ACT nasal spray 1 spray by Nasal route as needed for Rhinitis.     . folic acid (FOLVITE) 400 MCG tablet Take 1 mg by mouth daily.        Marland Kitchen glucosamine-chondroitin 500-400 MG tablet Take 1 tablet by mouth 3 (three) times daily.     Marland Kitchen HYDROcodone-acetaminophen (NORCO) 5-325 MG per tablet Take 1 tablet by mouth every 6 (six) hours as needed for Pain. 10 tablet 0   . hydroxychloroquine (PLAQUENIL) 200 MG tablet Take by mouth 2 (two) times daily.        Marland Kitchen levothyroxine (SYNTHROID, LEVOTHROID) 50 MCG tablet Take 150 mcg by mouth Once a day at 6:00am.        . liothyronine (CYTOMEL) 5 MCG tablet Take 5 mcg by mouth every morning.     . metFORMIN (GLUCOPHAGE) 500 MG tablet Take 500 mg by mouth every morning with breakfast.     . Mometasone Furo-Formoterol Fum 100-5 MCG/ACT Aerosol Inhale into the lungs as needed.     . PredniSONE 5 MG Tablet Delayed Response Take by mouth every morning.     . Progesterone Micronized (PROGESTERONE PO) Take 250 mg by mouth nightly.     . risedronate (ACTONEL) 150 MG tablet Take 150 mg by mouth every 30 (thirty) days. with water on empty stomach, nothing by mouth or lie down for next 30 minutes.     Marland Kitchen testosterone (ANDRODERM) 2 MG/24HR Patch 24 hr Place 1 patch onto the skin daily.     Marland Kitchen venlafaxine (EFFEXOR-XR) 75 MG 24 hr capsule TAKE 1 CAPSULE BY MOUTH EVERY DAY WITH FOOD 30 capsule 5   .  methotrexate 2.5 MG tablet Take 10 mg by mouth twice a week.       No current facility-administered medications for this visit.       Allergies:  No Known Allergies    Past Medical History:  Past Medical History   Diagnosis Date   . Arthritis    . Hypothyroidism      TAKES CYTOMEL & SYNTHROID   . Lupus      HAS ARTHRITIC TYPE SYMPTOMS   .  Depression    . Anxiety    . Type 1 diabetes mellitus      PATIENT STATES TAKIN METFORMIN ONLY AS A PRECAUTION FOR DIABETES-FBS ON ARRIVAL       Past Surgical History:  Past Surgical History   Procedure Laterality Date   . Trigger finger release Bilateral 16109 & 2014   . Colonoscopy  06/2013   . Tonsillectomy  1959   . Dilation & curretage  1984     AFTER A STILLBORN   . De quervain's release Bilateral 1988       Family History:  Family History   Problem Relation Age of Onset   . Heart disease Mother    . Cancer Father    . Heart disease Sister    . Heart disease Brother        Social History:  History     Social History   . Marital Status: Divorced     Spouse Name: N/A   . Number of Children: N/A   . Years of Education: N/A     Occupational History   . Not on file.     Social History Main Topics   . Smoking status: Former Smoker -- 1.00 packs/day for 25 years     Quit date: 12/09/1994   . Smokeless tobacco: Not on file   . Alcohol Use: 1.2 oz/week     2 Shots of liquor per week   . Drug Use: No   . Sexual Activity: No     Other Topics Concern   . Not on file     Social History Narrative       The following portions of the patient's history were reviewed and updated as appropriate: current medications, past family history, past medical history, past social history, past surgical history and problem list.    Vitals:  BP 158/84 mmHg  Pulse 96  Temp(Src) 98.4 F (36.9 C) (Oral)  Resp 16  Ht 1.626 m (5\' 4" )  Wt 93.441 kg (206 lb)  BMI 35.34 kg/m2  SpO2 95%    ROS:  General/Constitutional:           Denies Chills.  Denies Fatigue.  Denies Fever.       Ophthalmologic:            Denies Blurred vision.       ENT:           Denies Nasal Discharge.  Denies Sinus pain.  Denies Sore throat.       Endocrine:           Denies Polydipsia.  Denies Polyuria.       Respiratory:           Denies Cough.  Denies Orthopnea.  Denies Shortness of breath.  Denies Wheezing.       Cardiovascular:           Denies Chest pain.  Denies Chest pain with exertion.  Denies Palpitations.  Denies Swelling in hands/feet.       Gastrointestinal:           Denies Abdominal pain.  Denies Constipation.  Denies Diarrhea.  Denies Nausea.  Denies Vomiting.       Hematology:           Denies Easy bruising.  Denies Easy Bleeding.       Genitourinary:           Denies Blood in urine.  Denies Painful urination.  Peripheral Vascular:           Denies Pain/cramping in legs after exertion.  Denies Painful extremities.       Skin:           Denies Rash.       Neurologic:           Denies Dizziness.  Denies Pre-Syncope.  Denies Tingling/Numbness.       Psychiatric:           Denies Anxiety.  Denies Depressed mood.

## 2014-12-17 LAB — T4, FREE: T4, Free: 1.5 ng/dL (ref 0.82–1.77)

## 2014-12-17 LAB — CBC AND DIFFERENTIAL
Baso(Absolute): 0 10*3/uL (ref 0.0–0.2)
Basos: 1 %
Eos: 1 %
Eosinophils Absolute: 0.1 10*3/uL (ref 0.0–0.4)
Hematocrit: 40.7 % (ref 34.0–46.6)
Hemoglobin: 13.6 g/dL (ref 11.1–15.9)
Immature Granulocytes Absolute: 0 10*3/uL (ref 0.0–0.1)
Immature Granulocytes: 0 %
Lymphocytes Absolute: 2.6 10*3/uL (ref 0.7–3.1)
Lymphocytes: 35 %
MCH: 33.5 pg — ABNORMAL HIGH (ref 26.6–33.0)
MCHC: 33.4 g/dL (ref 31.5–35.7)
MCV: 100 fL — ABNORMAL HIGH (ref 79–97)
Monocytes Absolute: 0.7 10*3/uL (ref 0.1–0.9)
Monocytes: 9 %
Neutrophils Absolute: 4.2 10*3/uL (ref 1.4–7.0)
Neutrophils: 54 %
Platelets: 311 10*3/uL (ref 150–379)
RBC: 4.06 x10E6/uL (ref 3.77–5.28)
RDW: 14.5 % (ref 12.3–15.4)
WBC: 7.6 10*3/uL (ref 3.4–10.8)

## 2014-12-17 LAB — T3, FREE: T3, Free: 3.1 pg/mL (ref 2.0–4.4)

## 2014-12-17 LAB — TSH: TSH: 0.006 u[IU]/mL — ABNORMAL LOW (ref 0.450–4.500)

## 2014-12-17 LAB — ABO/RH: Rh Factor: POSITIVE

## 2014-12-26 NOTE — Anesthesia Preprocedure Evaluation (Addendum)
Anesthesia Evaluation    AIRWAY    Mallampati: III    TM distance: >3 FB  Neck ROM: full  Mouth Opening:full   CARDIOVASCULAR    cardiovascular exam normal       DENTAL    no notable dental hx     PULMONARY    pulmonary exam normal     OTHER FINDINGS            Appreciate assistance with preoperative assessment from Cathie Olden, NP.    (12/16/14) PCP Dr. Nedra Hai: At low risk, may proceed; SLE, immunocompromised on methotrexate and prednisone; RM (12/26/14)          Anesthesia Plan    ASA 2     general                     intravenous induction   Detailed anesthesia plan: general endotracheal        Post op pain management: per surgeon    informed consent obtained    Plan discussed with CRNA.    ECG reviewed (12/16/14) NSR, nl axis, no ischemic changes, isolated Q waves in III;RM (12/26/14)  pertinent labs reviewed (12/17/14) CBC 13.6/40.7, plt 311, MCV 100, TSH low 0.006, FT4 nl 1.5, FT3 nl 3.1; A+;RM (12/26/14)

## 2014-12-28 ENCOUNTER — Other Ambulatory Visit: Payer: PRIVATE HEALTH INSURANCE

## 2014-12-30 ENCOUNTER — Ambulatory Visit: Payer: PRIVATE HEALTH INSURANCE | Admitting: Nurse Practitioner

## 2014-12-30 ENCOUNTER — Encounter: Payer: Self-pay | Admitting: Anesthesiology

## 2014-12-30 ENCOUNTER — Ambulatory Visit: Payer: PRIVATE HEALTH INSURANCE | Admitting: Obstetrics & Gynecology

## 2014-12-30 ENCOUNTER — Ambulatory Visit
Admission: RE | Admit: 2014-12-30 | Discharge: 2014-12-31 | Disposition: A | Discharge status: Home / Self Care | Payer: PRIVATE HEALTH INSURANCE | Source: Ambulatory Visit | Attending: Obstetrics & Gynecology | Admitting: Obstetrics & Gynecology

## 2014-12-30 ENCOUNTER — Ambulatory Visit: Payer: Self-pay

## 2014-12-30 ENCOUNTER — Encounter
Admission: RE | Disposition: A | Discharge status: Home / Self Care | Payer: Self-pay | Source: Ambulatory Visit | Attending: Obstetrics & Gynecology

## 2014-12-30 DIAGNOSIS — Z87891 Personal history of nicotine dependence: Secondary | ICD-10-CM | POA: Insufficient documentation

## 2014-12-30 DIAGNOSIS — M329 Systemic lupus erythematosus, unspecified: Secondary | ICD-10-CM | POA: Insufficient documentation

## 2014-12-30 DIAGNOSIS — Z01818 Encounter for other preprocedural examination: Secondary | ICD-10-CM

## 2014-12-30 DIAGNOSIS — E039 Hypothyroidism, unspecified: Secondary | ICD-10-CM | POA: Insufficient documentation

## 2014-12-30 DIAGNOSIS — N950 Postmenopausal bleeding: Secondary | ICD-10-CM | POA: Insufficient documentation

## 2014-12-30 DIAGNOSIS — D219 Benign neoplasm of connective and other soft tissue, unspecified: Secondary | ICD-10-CM | POA: Diagnosis present

## 2014-12-30 DIAGNOSIS — D259 Leiomyoma of uterus, unspecified: Secondary | ICD-10-CM | POA: Insufficient documentation

## 2014-12-30 HISTORY — PX: ROBOT ASSISTED, LAPAROSCOPIC, SINGLE SITE, HYSTERECTOMY, TOTAL, BSO: SHX5485

## 2014-12-30 LAB — GLUCOSE WHOLE BLOOD - POCT: Whole Blood Glucose POCT: 114 mg/dL — ABNORMAL HIGH (ref 70–100)

## 2014-12-30 LAB — TYPE AND SCREEN
AB Screen Gel: NEGATIVE
ABO Rh: A POS

## 2014-12-30 SURGERY — ROBOT ASSISTED, LAPAROSCOPIC, SINGLE SITE, HYSTERECTOMY, TOTAL, BSO
Anesthesia: Anesthesia General | Wound class: Clean Contaminated

## 2014-12-30 MED ORDER — PROMETHAZINE HCL 25 MG/ML IJ SOLN
INTRAMUSCULAR | Status: AC
Start: 2014-12-30 — End: ?
  Filled 2014-12-30: qty 1

## 2014-12-30 MED ORDER — LACTATED RINGERS IV SOLN
INTRAVENOUS | Status: DC
Start: 2014-12-30 — End: 2014-12-30
  Administered 2014-12-30: 1000 mL via INTRAVENOUS

## 2014-12-30 MED ORDER — CEFAZOLIN SODIUM 1 G IJ SOLR
INTRAMUSCULAR | Status: AC
Start: 2014-12-30 — End: 2014-12-31
  Filled 2014-12-30: qty 1000

## 2014-12-30 MED ORDER — METRONIDAZOLE IN NACL 500 MG/100 ML IV SOLN
500.0000 mg | Freq: Once | INTRAVENOUS | Status: DC
Start: 2014-12-30 — End: 2014-12-30

## 2014-12-30 MED ORDER — CEFAZOLIN SODIUM 1 G IJ SOLR
2.0000 g | Freq: Once | INTRAMUSCULAR | Status: AC
Start: 2014-12-30 — End: 2014-12-30
  Administered 2014-12-30: 2 g via INTRAVENOUS

## 2014-12-30 MED ORDER — GLYCOPYRROLATE 0.2 MG/ML IJ SOLN
INTRAMUSCULAR | Status: DC | PRN
Start: 2014-12-30 — End: 2014-12-30
  Administered 2014-12-30: 0.4 mg via INTRAVENOUS

## 2014-12-30 MED ORDER — MORPHINE SULFATE 2 MG/ML IJ/IV SOLN (WRAP)
2.0000 mg | Status: DC | PRN
Start: 2014-12-30 — End: 2014-12-30

## 2014-12-30 MED ORDER — LACTATED RINGERS IV SOLN
INTRAVENOUS | Status: DC
Start: 2014-12-30 — End: 2014-12-31

## 2014-12-30 MED ORDER — NEOSTIGMINE METHYLSULFATE 1 MG/ML IJ SOLN
INTRAMUSCULAR | Status: DC | PRN
Start: 2014-12-30 — End: 2014-12-30
  Administered 2014-12-30: 3 mg via INTRAVENOUS

## 2014-12-30 MED ORDER — LIDOCAINE 1% BUFFERED - CNR/OUTSOURCED
0.3000 mL | Freq: Once | INTRAMUSCULAR | Status: AC
Start: 2014-12-30 — End: 2014-12-30
  Administered 2014-12-30: 0.3 mL via INTRADERMAL

## 2014-12-30 MED ORDER — PROPOFOL 10 MG/ML IV EMUL (WRAP)
INTRAVENOUS | Status: AC
Start: 2014-12-30 — End: ?
  Filled 2014-12-30: qty 20

## 2014-12-30 MED ORDER — ONDANSETRON HCL 4 MG/2ML IJ SOLN
4.0000 mg | Freq: Four times a day (QID) | INTRAMUSCULAR | Status: DC | PRN
Start: 2014-12-30 — End: 2014-12-31

## 2014-12-30 MED ORDER — LEVOTHYROXINE SODIUM 75 MCG PO TABS
150.0000 ug | ORAL_TABLET | Freq: Every day | ORAL | Status: DC
Start: 2014-12-31 — End: 2014-12-31
  Administered 2014-12-31: 150 ug via ORAL
  Filled 2014-12-30 (×4): qty 2

## 2014-12-30 MED ORDER — CISATRACURIUM BESYLATE (PF) 10 MG/5ML IV SOLN
INTRAVENOUS | Status: DC | PRN
Start: 2014-12-30 — End: 2014-12-30
  Administered 2014-12-30: 20 mg via INTRAVENOUS
  Administered 2014-12-30: 10 mg via INTRAVENOUS

## 2014-12-30 MED ORDER — LIDOCAINE(URO-JET) 2% JELLY (WRAP)
CUTANEOUS | Status: DC | PRN
Start: 2014-12-30 — End: 2014-12-30
  Administered 2014-12-30: 3 mL via TOPICAL

## 2014-12-30 MED ORDER — HYDROMORPHONE HCL 1 MG/ML IJ SOLN
0.5000 mg | INTRAMUSCULAR | Status: DC | PRN
Start: 2014-12-30 — End: 2014-12-30

## 2014-12-30 MED ORDER — MORPHINE SULFATE 2 MG/ML IJ/IV SOLN (WRAP)
2.0000 mg | Status: DC | PRN
Start: 2014-12-30 — End: 2014-12-31

## 2014-12-30 MED ORDER — PROMETHAZINE HCL 25 MG/ML IJ SOLN
6.2500 mg | Freq: Once | INTRAMUSCULAR | Status: DC | PRN
Start: 2014-12-30 — End: 2014-12-30

## 2014-12-30 MED ORDER — MIDAZOLAM HCL 2 MG/2ML IJ SOLN
INTRAMUSCULAR | Status: AC
Start: 2014-12-30 — End: ?
  Filled 2014-12-30: qty 2

## 2014-12-30 MED ORDER — HYDROMORPHONE HCL 1 MG/ML IJ SOLN
INTRAMUSCULAR | Status: AC
Start: 2014-12-30 — End: ?
  Filled 2014-12-30: qty 1

## 2014-12-30 MED ORDER — ONDANSETRON HCL 4 MG/2ML IJ SOLN
4.0000 mg | Freq: Once | INTRAMUSCULAR | Status: DC | PRN
Start: 2014-12-30 — End: 2014-12-30

## 2014-12-30 MED ORDER — FENTANYL CITRATE (PF) 50 MCG/ML IJ SOLN (WRAP)
INTRAMUSCULAR | Status: DC | PRN
Start: 2014-12-30 — End: 2014-12-30
  Administered 2014-12-30 (×6): 50 ug via INTRAVENOUS

## 2014-12-30 MED ORDER — NAPROXEN 250 MG PO TABS
500.0000 mg | ORAL_TABLET | Freq: Three times a day (TID) | ORAL | Status: DC | PRN
Start: 2014-12-30 — End: 2014-12-30
  Filled 2014-12-30: qty 2

## 2014-12-30 MED ORDER — METRONIDAZOLE IN NACL 500 MG/100 ML IV SOLN
500.0000 mg | Freq: Three times a day (TID) | INTRAVENOUS | Status: AC
Start: 2014-12-30 — End: 2014-12-31
  Administered 2014-12-30 – 2014-12-31 (×2): 500 mg via INTRAVENOUS
  Filled 2014-12-30 (×2): qty 100

## 2014-12-30 MED ORDER — DEXTROSE 5 % IV SOLN
2.0000 g | Freq: Three times a day (TID) | INTRAVENOUS | Status: AC
Start: 2014-12-30 — End: 2014-12-31
  Administered 2014-12-30 – 2014-12-31 (×2): 2 g via INTRAVENOUS
  Filled 2014-12-30 (×2): qty 2000

## 2014-12-30 MED ORDER — MEPERIDINE HCL 25 MG/ML IJ SOLN
25.0000 mg | INTRAMUSCULAR | Status: DC | PRN
Start: 2014-12-30 — End: 2014-12-30

## 2014-12-30 MED ORDER — BACITRACIN 500 UNIT/GM EX OINT
TOPICAL_OINTMENT | CUTANEOUS | Status: DC | PRN
Start: 2014-12-30 — End: 2014-12-30
  Administered 2014-12-30: 1 g via TOPICAL

## 2014-12-30 MED ORDER — METRONIDAZOLE IN NACL 500 MG/100 ML IV SOLN
INTRAVENOUS | Status: AC
Start: 2014-12-30 — End: 2014-12-30
  Administered 2014-12-30: 500 mg via INTRAVENOUS
  Filled 2014-12-30: qty 100

## 2014-12-30 MED ORDER — FENTANYL CITRATE (PF) 50 MCG/ML IJ SOLN (WRAP)
INTRAMUSCULAR | Status: AC
Start: 2014-12-30 — End: ?
  Filled 2014-12-30: qty 2

## 2014-12-30 MED ORDER — ONDANSETRON HCL 4 MG/2ML IJ SOLN
INTRAMUSCULAR | Status: AC
Start: 2014-12-30 — End: ?
  Filled 2014-12-30: qty 2

## 2014-12-30 MED ORDER — DOCUSATE SODIUM 100 MG PO CAPS
100.0000 mg | ORAL_CAPSULE | Freq: Two times a day (BID) | ORAL | Status: DC
Start: 2014-12-30 — End: 2014-12-31
  Administered 2014-12-30 – 2014-12-31 (×2): 100 mg via ORAL
  Filled 2014-12-30 (×2): qty 1

## 2014-12-30 MED ORDER — CISATRACURIUM BESYLATE (PF) 10 MG/5ML IV SOLN
INTRAVENOUS | Status: AC
Start: 2014-12-30 — End: ?
  Filled 2014-12-30: qty 10

## 2014-12-30 MED ORDER — ONDANSETRON HCL 4 MG/2ML IJ SOLN
INTRAMUSCULAR | Status: DC | PRN
Start: 2014-12-30 — End: 2014-12-30
  Administered 2014-12-30: 4 mg via INTRAVENOUS

## 2014-12-30 MED ORDER — DEXAMETHASONE SODIUM PHOSPHATE 4 MG/ML IJ SOLN (WRAP)
INTRAMUSCULAR | Status: DC | PRN
Start: 2014-12-30 — End: 2014-12-30
  Administered 2014-12-30: 4 mg via INTRAVENOUS

## 2014-12-30 MED ORDER — ONDANSETRON 4 MG PO TBDP
4.0000 mg | ORAL_TABLET | Freq: Four times a day (QID) | ORAL | Status: DC | PRN
Start: 2014-12-30 — End: 2014-12-31

## 2014-12-30 MED ORDER — PROMETHAZINE HCL 25 MG/ML IJ SOLN
INTRAMUSCULAR | Status: DC | PRN
Start: 2014-12-30 — End: 2014-12-30
  Administered 2014-12-30: 6.25 mg via INTRAVENOUS

## 2014-12-30 MED ORDER — HYDROMORPHONE HCL 1 MG/ML IJ SOLN
INTRAMUSCULAR | Status: DC | PRN
Start: 2014-12-30 — End: 2014-12-30
  Administered 2014-12-30 (×2): 0.5 mg via INTRAVENOUS

## 2014-12-30 MED ORDER — OXYCODONE-ACETAMINOPHEN 5-325 MG PO TABS
1.0000 | ORAL_TABLET | ORAL | Status: DC | PRN
Start: 2014-12-30 — End: 2014-12-31
  Administered 2014-12-30 – 2014-12-31 (×2): 1 via ORAL
  Filled 2014-12-30 (×2): qty 1

## 2014-12-30 MED ORDER — MIDAZOLAM HCL 2 MG/2ML IJ SOLN
INTRAMUSCULAR | Status: DC | PRN
Start: 2014-12-30 — End: 2014-12-30
  Administered 2014-12-30: 2 mg via INTRAVENOUS

## 2014-12-30 MED ORDER — PROPOFOL 10 MG/ML IV EMUL (WRAP)
INTRAVENOUS | Status: DC | PRN
Start: 2014-12-30 — End: 2014-12-30
  Administered 2014-12-30: 160 mg via INTRAVENOUS

## 2014-12-30 MED ORDER — NEOSTIGMINE METHYLSULFATE 1 MG/ML IJ SOLN
INTRAMUSCULAR | Status: AC
Start: 2014-12-30 — End: ?
  Filled 2014-12-30: qty 10

## 2014-12-30 MED ORDER — LIDOCAINE HCL 2 % IJ SOLN
INTRAMUSCULAR | Status: DC | PRN
Start: 2014-12-30 — End: 2014-12-30
  Administered 2014-12-30: 100 mg

## 2014-12-30 MED ORDER — ZOLPIDEM TARTRATE 5 MG PO TABS
5.0000 mg | ORAL_TABLET | Freq: Every evening | ORAL | Status: DC | PRN
Start: 2014-12-30 — End: 2014-12-31

## 2014-12-30 MED ORDER — LIDOCAINE HCL (PF) 2 % IJ SOLN
INTRAMUSCULAR | Status: AC
Start: 2014-12-30 — End: ?
  Filled 2014-12-30: qty 5

## 2014-12-30 MED ORDER — IBUPROFEN 600 MG PO TABS
600.0000 mg | ORAL_TABLET | Freq: Four times a day (QID) | ORAL | Status: DC
Start: 2014-12-30 — End: 2014-12-31
  Administered 2014-12-30 – 2014-12-31 (×3): 600 mg via ORAL
  Filled 2014-12-30 (×3): qty 1

## 2014-12-30 MED ORDER — BACITRACIN 500 UNIT/GM EX OINT
TOPICAL_OINTMENT | CUTANEOUS | Status: AC
Start: 2014-12-30 — End: ?
  Filled 2014-12-30: qty 0.9

## 2014-12-30 MED ORDER — KETOROLAC TROMETHAMINE 30 MG/ML IJ SOLN
INTRAMUSCULAR | Status: AC
Start: 2014-12-30 — End: ?
  Filled 2014-12-30: qty 1

## 2014-12-30 MED ORDER — ENOXAPARIN SODIUM 30 MG/0.3ML SC SOLN
30.0000 mg | Freq: Every morning | SUBCUTANEOUS | Status: DC
Start: 2014-12-31 — End: 2014-12-31
  Administered 2014-12-31: 30 mg via SUBCUTANEOUS
  Filled 2014-12-30 (×3): qty 0.3

## 2014-12-30 MED ORDER — METHYLPREDNISOLONE SODIUM SUCC 125 MG IJ SOLR
INTRAMUSCULAR | Status: AC
Start: 2014-12-30 — End: ?
  Filled 2014-12-30: qty 2

## 2014-12-30 MED ORDER — ROPIVACAINE HCL 5 MG/ML IJ SOLN
INTRAMUSCULAR | Status: DC | PRN
Start: 2014-12-30 — End: 2014-12-30
  Administered 2014-12-30: 30 mL

## 2014-12-30 MED ORDER — GLYCOPYRROLATE 1 MG/5ML IJ SOLN
INTRAMUSCULAR | Status: AC
Start: 2014-12-30 — End: ?
  Filled 2014-12-30: qty 5

## 2014-12-30 MED ORDER — FENTANYL CITRATE (PF) 50 MCG/ML IJ SOLN (WRAP)
INTRAMUSCULAR | Status: AC
Start: 2014-12-30 — End: ?
  Filled 2014-12-30: qty 4

## 2014-12-30 SURGICAL SUPPLY — 80 items
ADHESIVE SKIN CLOSURE DERMABOND MINI .36 (Suture) ×1
ADHESIVE SKIN CLOSURE DERMABOND MINI .36 ML LIQUID APPLICATOR (Suture) ×1 IMPLANT
ADHESIVE SKNCLS 2 OCTYL CYNCRLT .36ML MN (Suture) ×1
APPLCATOR CHLORAPREP 26ML (Prep) ×2 IMPLANT
BARRIER ADHESION L4 IN X W3 IN GYNECARE (Mesh) ×1 IMPLANT
BARRIER ADHESION L4 IN X W3 IN GYNECARE INTERCEED PELVIC ABSORBABLE (Mesh) ×1 IMPLANT
BARRIER ADHSN INTERCEED 3X4IN (Mesh) ×1 IMPLANT
CLIP INTERNAL MEDIUM LARGE LIGATE (Clips) ×1
CLIP INTERNAL MEDIUM LARGE LIGATE NONABSORBABLE CARTRIDGE WECK (Clips) ×1 IMPLANT
CLIP INTNL PLMR MED LG WECK HEM-O-LOK LF (Clips) ×1
COUNTER 20 COUNT MAGNET DEVON BLACK SHARPS 1820 PLASTIC FOAM LARGE (Patient Supply) ×1 IMPLANT
COUNTER SHRP PLS FM LG DVN BOXLOCKS LF (Patient Supply) ×2 IMPLANT
COVER CAM LF STRL LEN DISP CLR (Procedure Accessories) ×2
COVER CAMERA LENS DISPOSABLE CLEAR STERILE LATEX FREE (Procedure Accessories) ×1 IMPLANT
COVER MAYO CNVRT STND 23IN PLS REINF (Drape) ×1
COVER STAND W23 IN REINFORCE PLASTIC (Drape) ×1 IMPLANT
COVER STND PLS MAYO CNVRT 23IN LF STRL (Drape) ×1 IMPLANT
DRAPE 3-ARM (Drape) ×2
DRAPE ACCESSORY 3 ARM DA VINCI 20X13X10.5IN 420290 (Drape) ×1 IMPLANT
DRESSING SCR TGDRM 4.5X3.5IN LF STRL FLM (Dressing) ×1
DRESSING SECUREMENT TEGADERM L4 1/2 IN X (Dressing) ×1 IMPLANT
DRESSING SECUREMENT TEGADERM L4 1/2 IN X W3 1/2 IN INTRAVENOUS FILM (Dressing) ×1 IMPLANT
DRESSING TRANSPARENT L4 3/4 IN X W4 IN (Dressing) ×1 IMPLANT
DRESSING TRANSPARENT L4 3/4 IN X W4 IN POLYURETHANE ADHESIVE (Dressing) ×1 IMPLANT
DRESSING TRNS PU STD TGDRM 4.75X4IN LF (Dressing) ×1
ELECTRODE ELECTROSURGICAL BLADE PENCIL (Cautery) ×1
ELECTRODE ELECTROSURGICAL BLADE PENCIL L10 FT VALLEYLAB E2515H (Cautery) ×1 IMPLANT
ELECTRODE ESURG SS BLDE PNCL VLAB 10FT (Cautery) ×1
FORCEPS BIPLR FEN SNGL 5MM (Procedure Accessories) IMPLANT
GLOVE SRG NTR RBR 8 INDCTR BGL 299X103MM (Glove) ×2
GLOVE SURG BIOGEL INDIC SZ 8.5 (Glove) ×2 IMPLANT
GLOVE SURGICAL 8 INDICATOR BIOGEL POWDER (Glove) ×2
GLOVE SURGICAL 8 INDICATOR BIOGEL POWDER FREE SMOOTH BEAD CUFF (Glove) ×2 IMPLANT
IRRIGATOR SUCTION ERGONOMIC HAND PIECE STRYKEFLOW II (Suction) ×1 IMPLANT
IRRIGATOR SUCTION STRYKEFLOW 2 (Suction) ×1
KIT CLOSURE PROCEDURE (Suture) IMPLANT
KIT ROBOTIC GYN (Kits) ×2 IMPLANT
MANIPULATOR CERVIX OD3.5 CM ENDOSCOPIC (Disposable Instruments) ×1
MANIPULATOR UTERINE COLPO OD3.5 CM KOH-EFFICIENT RUMI II PLASTIC (Disposable Instruments) ×1 IMPLANT
OBTURATOR BLDLS DISP 8MM (Procedure Accessories)
OBTURATOR ROBOTIC BLADELESS SHORT DA VINCI SI ENDOWRIST 8MM 420023 (Procedure Accessories) IMPLANT
PACK POSITIONING TRENGUARD 450 (Pack) ×1 IMPLANT
PACK PROCEDURE TRENGUARD (Pack) ×1
PORT HAND ACCESS SINGLE-SITE ID8.5 MM (Instrument) ×1 IMPLANT
PORT HND ACC SGL-ST 8.5MM LF STRL DISP (Instrument) ×2 IMPLANT
PROTECTOR NERVE ULNAR (Positioning Supplies) ×2 IMPLANT
RUMI KOH-EFFICIENT PS TP 3.5CM (Disposable Instruments) ×1
SEAL CANNULA 5MM (Procedure Accessories) ×2 IMPLANT
SEAL CANNULA 8.5-13MM (Procedure Accessories) ×2 IMPLANT
SET IRR 82IN 10 GTT/ML Y DEHP BLDR REG (Tubing)
SET IRRIGATION L82 IN 10 GTT/ML Y DEHP (Tubing)
SET IRRIGATION L82 IN 10 GTT/ML Y NA DEHP BLADDER REGULATE CLAMP (Tubing) IMPLANT
SOL H2O STERILE POUR 1500ML (Irrigation Solutions) ×4 IMPLANT
SOLUTION IRR LR 3L ARTHMTC LF PLS CNTNR (Irrigation Solutions) ×1
SOLUTION IRRIGATION LACTATED RINGERS (Irrigation Solutions) ×1
SOLUTION IRRIGATION LACTATED RINGERS 3000 ML PLASTIC CONTAINER (Irrigation Solutions) ×1 IMPLANT
SOLUTION IV LACTATED RINGERS 1000 ML (IV Solutions) ×1
SOLUTION IV LACTATED RINGERS 1000 ML PLASTIC CONTAINER (IV Solutions) ×1 IMPLANT
SOLUTION IV LR 1000ML VFLX LF PLS CNTNR (IV Solutions) ×1
SOLUTION PREP BETADINE 10% PVP IODINE 8OZ BOTTLE SKIN (Prep) ×1 IMPLANT
SOLUTION PRP 10% PVP IOD 8OZ BDINE BTL (Prep) ×2
SPONGE GAUZE L2 IN X W2 IN 4 PLY HIGH (Sponge) ×1
SPONGE GAUZE L2 IN X W2 IN 4 PLY HIGH ABSORBENT NONWOVEN LINT FREE (Sponge) ×1 IMPLANT
SPONGE GZE RYN PLSTR KDL CRTY 2X2IN LF (Sponge) ×1
SUTURE ABS 0 VCL STPK 18IN BRD TIE 6 (Suture)
SUTURE ABS 4-0 PC5 VCL MTPS 18IN BRD (Suture) ×1
SUTURE COATED VICRYL 0 L18 IN BRAID TIES (Suture)
SUTURE COATED VICRYL 0 L18 IN BRAID TIES 6 STRAND COATED UNDYED (Suture) IMPLANT
SUTURE COATED VICRYL 4-0 PC-5 L18 IN (Suture) ×1 IMPLANT
SUTURE COATED VICRYL 4-0 PC-5 L18 IN BRAID COATED UNDYED ABSORBABLE (Suture) ×1 IMPLANT
SUTURE V-LOC 90 2-0 P-14 3/8 CIRCLE L12 IN ABS UNDYED (Suture) ×1 IMPLANT
SUTURE V-LOC 90 2-0 P-14 L12 IN (Suture) ×1 IMPLANT
SUTURE V-LOC ABSRBSZ2 12IN (Suture) ×1
SUTURE VICRYL 0 CT1 36IN (Suture) ×4 IMPLANT
SUTURE VICRYL 0 UR6 27IN (Suture) ×6 IMPLANT
SYRINGE BULB 60CC LATEX FREE (Syringes, Needles) ×2 IMPLANT
TIP MANIPULATOR RUMI II OD6.7 MM (Procedure Accessories) ×1
TIP MANIPULATOR RUMI II OD6.7 MM FLEXIBLE UTERINE L8 CM BLUE (Procedure Accessories) ×1 IMPLANT
TIP RUMI 6.7MM X 8CM (Procedure Accessories) ×1
TUBING SCT IRR (Suction) ×1

## 2014-12-30 NOTE — H&P (Signed)
Patient Type: A     ATTENDING PHYSICIAN: Michail Jewels, MD     HISTORY OF PRESENT ILLNESS:  The patient is postmenopausal at 64 years of age.  She has had recurrent  postmenopausal bleeding along with a thickened lining.  However,  endometrial biopsy was negative.  After discussion as to follow up, she  opted for hysterectomy with bilateral salpingectomy.  Her mammogram was  normal.     OBSTETRICAL HISTORY:  Uncomplicated and no prior surgical history noted.     REVIEW OF SYSTEMS:  Noncontributory.  She does see Dr. Meyer Cory for some rheumatology issues.   See her notes for medications, etcetera.     PHYSICAL EXAMINATION:  VITAL SIGNS:  Within normal limits.    HEART:  Regular rate.  CHEST:  Clear to auscultation.    ABDOMEN:  Soft with normal bowel sounds.  EXTREMITIES:  Normal.     INITIAL IMPRESSION:  Postmenopausal female with postmenopausal bleeding with thickened  endometrial lining.       PLAN:    Robotic hysterectomy, bilateral salpingectomies.           D:  12/30/2014 12:50 PM by Dr. Donovan Kail C. Sima Lindenberger, MD [phone])  T:  12/30/2014 13:09 PM by [id number]      [name]: [id number]) (Doc ID: [id number])

## 2014-12-30 NOTE — Anesthesia Postprocedure Evaluation (Signed)
Anesthesia Post Evaluation    Patient: Lori Arnold    Procedure(s) with comments:  ROBOT ASSISTED, LAPAROSCOPIC, SINGLE SITE, HYSTERECTOMY, TOTAL BSO POSSIBLE DUAL PORT POSSIBLE  OPEN  - ROBOT ASSISTED, LAPAROSCOPIC, SINGLE SITE, HYSTERECTOMY, TOTAL BSO POSSIBLE DUAL PORT POSSIBLE  OPEN     Anesthesia type: general    Last Vitals:   Filed Vitals:    12/30/14 1311   BP: 161/85   Pulse: 85   Temp: 37.3 C (99.2 F)   Resp: 18   SpO2: 95%            Anesthesia Qualified Clinical Data Registry    Central Line      CVC insertion : NO                                               Perioperative temperature management      General/neuraxial anesthesia > or = 60 minutes (excluding CABG) : YES              > Use of intraoperative active warming : YES              > Temperature > or = 36 degrees Centigrade (96.8 degrees Farenheit) during time span from 30 minutes before up to 15 minutes after anesthesia end time : YES      Administration of antibiotic prophylaxis      Age > or = 18, with IV access, with surgical procedure for which antibiotic prophylaxis indicated, and not on chronic antibiotics : YES              > Prophylactic antibiotics within 1 hour of incision (or fluroroquinolone/vancomycin within 2 hours of incision) : YES    Medication Administration      Ordering or administration of drug inconsistent with intended drug, dose, delivery or timing : NO      Dental loss/damage      Dental injury with administration of anesthesia : NO      Difficult intubation due to unrecognized difficult airway        Elective airway procedure including but not limited to: tracheostomy, fiberoptic bronchoscopy, rigid bronchoscopy; jet ventilation; or elective use of a device to facilitate airway management such as a Glidescope : NO                > Unanticipated difficult intubation post pre-evaluation : NO      Aspiration of gastric contents        Aspiration of gastric contents : NO                    Surgical fire         Procedure requiring electrocautery/laser : YES                > Ignition/burning in invasive procedure location : NO      Immediate perioperative cardiac arrest        Cardiac arrest in OR or PACU : NO                    Unplanned hospital admission        Unplanned hospital admission for initially intended outpatient anesthesia service : NO      Unplanned ICU admission        Unplanned ICU admission related to anesthesia occurring within 24  hours of induction or start of MAC : NO      Surgical case cancellation        Cancellation of procedure after care already started by anesthesia care team : NO      Post-anesthesia transfer of care checklist/protocol to PACU        Transfer from OR to PACU upon case conclusion : YES              > Use of PACU transfer checklist/protocol : YES     (Includes the key elements of: patient identification, responsible practitioner identification (PACU nurse or advanced practitioner), discussion of pertinent history and procedure course, intraoperative anesthetic management, post-procedure plans, acknowledgement/questions)    Post-anesthesia transfer of care checklist/protocol to ICU        Transfer from OR to ICU upon case conclusion : NO                    Post-operative nausea/vomiting risk protocol        Post-operative nausea/vomiting risk protocol : YES  Patient > or = 18 with care initiated by anesthesia team that has a risk factor screen for post-op nausea/vomiting (Includes female, hx PONV, or motion sickness, non-smoker, intended opioid administration for post-op analgesia.)    Anaphylaxis        Anaphylaxis during anesthesia services : NO    (Inclusive of any suspected transfusion reaction in association with blood-bank confirmed blood product incompatibility)              Daschel Roughton, 12/30/2014 3:46 PM

## 2014-12-30 NOTE — Plan of Care (Signed)
Problem: Safety  Goal: Patient will be free from injury during hospitalization  Outcome: Progressing  Patient alert and oriented. Oriented to room and call bell. Bed in the lowest position. Bedside table and belongings within reach. No needs identified at this time will continue to monitor.     Problem: Pain  Goal: Patient's pain/discomfort is manageable  Outcome: Progressing  Patient denies pain during assessment, will continue to monitor per protocol     Problem: Psychosocial and Spiritual Needs  Goal: Demonstrates ability to cope with hospitalization/illness  Outcome: Progressing  Patient able to make needs known

## 2014-12-30 NOTE — Anesthesia Postprocedure Evaluation (Signed)
Anesthesia Post Evaluation    Patient: Lori Arnold    Procedure(s) with comments:  ROBOT ASSISTED, LAPAROSCOPIC, SINGLE SITE, HYSTERECTOMY, TOTAL BSO WITH CYSTOSCOPY - ROBOT ASSISTED, LAPAROSCOPIC, SINGLE SITE, HYSTERECTOMY, TOTAL BSO WITH CYSTOSCOPY    Anesthesia type: general    Last Vitals:   Filed Vitals:    12/30/14 1821   BP: 132/79   Pulse: 80   Temp: 37.2 C (98.9 F)   Resp: 17   SpO2: 99%       Patient Location: Phase I PACU      Post Pain: Patient not complaining of pain, continue current therapy    Mental Status: awake    Respiratory Function: tolerating room air    Cardiovascular: stable    Nausea/Vomiting: patient not complaining of nausea or vomiting    Hydration Status: adequate    Post Assessment: no apparent anesthetic complications and no reportable events    QCDR IS INCOMPLETE           Pandora Leiter, 12/30/2014 6:29 PM

## 2014-12-30 NOTE — Brief Op Note (Signed)
GYN Post Op Note  Op note  ID NUMBER      Date Time: 12/30/2014 5:19 PM  Patient Name: Lori Arnold  Attending Physician: Orson Aloe, MD    Date Time: 12/30/2014 5:19 PM    Patient Name:   Lori Arnold    Date of Operation:   12/30/2014    Providers Performing:   Surgeon(s):  Orson Aloe, MD    Assistant (s): [place], Associate Professor (PA student)    Operative Procedure:   Procedure(s):  ROBOT ASSISTED, LAPAROSCOPIC, SINGLE SITE, HYSTERECTOMY, TOTAL BSO WITH CYSTOSCOPY    Preoperative Diagnosis:   Pre-Op Diagnosis Codes:     * Hemorrhage, postmenopausal [N95.0]     * Uterine leiomyoma, unspecified location [D25.9]    Postoperative Diagnosis:   * No post-op diagnosis entered *    Anesthesia:   Choice      Estimated Blood Loss:      100cc    Input & Output:     Intake/Output Summary (Last 24 hours) at 12/30/14 1719  Last data filed at 12/30/14 1707   Gross per 24 hour   Intake   1700 ml   Output    300 ml   Net   1400 ml       Specimens:        SPECIMENS (last 24 hours)      Pathology Specimens       12/30/14 1552             Additional Information    Send final report to: Dr. Tristan Schroeder       Specimen Information    Specimen Testing Required Routine Pathology       Specimen ID  A)       Specimen Description Uterus, Cervix, Bilateral tubes and ovaries           Findings:   Normal uterus    Complications:   None.    Medications / Herbals / OTC:     Prescriptions prior to admission   Medication Sig   . buPROPion XL (WELLBUTRIN XL) 300 MG 24 hr tablet Take 300 mg by mouth every morning.   . Cholecalciferol (VITAMIN D3) 5000 UNITS Tab Take by mouth.   Marland Kitchen DHEA 25 MG Cap Take by mouth.   . estradiol (VIVELLE-DOT) 0.0375 MG/24HR Place 1 patch onto the skin twice a week.   Marland Kitchen Fesoterodine Fumarate (TOVIAZ PO) Take 4 mg by mouth every morning.      . folic acid (FOLVITE) 400 MCG tablet Take 1 mg by mouth daily.      Marland Kitchen glucosamine-chondroitin 500-400 MG tablet Take 1 tablet by mouth 3 (three) times daily.   Marland Kitchen  HYDROcodone-acetaminophen (NORCO) 5-325 MG per tablet Take 1 tablet by mouth every 6 (six) hours as needed for Pain.   . hydroxychloroquine (PLAQUENIL) 200 MG tablet Take by mouth 2 (two) times daily.      Marland Kitchen levothyroxine (SYNTHROID, LEVOTHROID) 50 MCG tablet Take 150 mcg by mouth Once a day at 6:00am.      . liothyronine (CYTOMEL) 5 MCG tablet Take 5 mcg by mouth every morning.   . metFORMIN (GLUCOPHAGE) 500 MG tablet Take 500 mg by mouth every morning with breakfast.   . methotrexate 2.5 MG tablet Take 10 mg by mouth twice a week.   . PredniSONE 5 MG Tablet Delayed Response Take by mouth every morning.   . Progesterone Micronized (PROGESTERONE PO) Take 250 mg by mouth nightly.   Marland Kitchen  risedronate (ACTONEL) 150 MG tablet Take 150 mg by mouth every 30 (thirty) days. with water on empty stomach, nothing by mouth or lie down for next 30 minutes.   Marland Kitchen testosterone (ANDRODERM) 2 MG/24HR Patch 24 hr Place 1 patch onto the skin daily.   Marland Kitchen venlafaxine (EFFEXOR-XR) 75 MG 24 hr capsule TAKE 1 CAPSULE BY MOUTH EVERY DAY WITH FOOD   . cetirizine (ZYRTEC) 10 MG chewable tablet Chew 10 mg by mouth daily.   . fluticasone (FLONASE) 50 MCG/ACT nasal spray 1 spray by Nasal route as needed for Rhinitis.   . Mometasone Furo-Formoterol Fum 100-5 MCG/ACT Aerosol Inhale into the lungs as needed.         Allergies:   No Known Allergies      Objective:     Filed Vitals:    12/30/14 1311   BP: 161/85   Pulse: 85   Temp: 99.2 F (37.3 C)   Resp: 18   SpO2: 95%             Results     Procedure Component Value Units Date/Time    Type and Screen [[id number]] Collected:  12/30/14 1308    Specimen Information:  Blood Updated:  12/30/14 1433     ABO Rh A POS      AB Screen Gel NEG     Glucose Whole Blood - POCT [[id number]]  (Abnormal) Collected:  12/30/14 1307     POCT - Glucose Whole blood 114 (H) mg/dL Updated:  30/86/57 8469          Assessment:   A/P:  Lori Arnold is a 64 y.o. post Procedure(s):  ROBOT ASSISTED, LAPAROSCOPIC, SINGLE SITE,  HYSTERECTOMY, TOTAL BSO WITH CYSTOSCOPY Day of Surgery           Signed by: Orson Aloe, MD                                                                              [place] MAIN OR

## 2014-12-30 NOTE — Progress Notes (Signed)
Patient ambulated well to the bathroom with minimal assistance, she voided 500cc without incident. Patient tolerated soup and crackers without incident. Will continue to monitor.

## 2014-12-30 NOTE — Anesthesia Postprocedure Evaluation (Signed)
Anesthesia Post Evaluation    Patient: Lori Arnold    Procedure(s) with comments:  ROBOT ASSISTED, LAPAROSCOPIC, SINGLE SITE, HYSTERECTOMY, TOTAL BSO WITH CYSTOSCOPY - ROBOT ASSISTED, LAPAROSCOPIC, SINGLE SITE, HYSTERECTOMY, TOTAL BSO WITH CYSTOSCOPY    Anesthesia type: general    Last Vitals:   Filed Vitals:    12/30/14 1726   BP: 151/89   Pulse: 84   Temp: 36.5 C (97.7 F)   Resp: 12   SpO2: 100%            Anesthesia Qualified Clinical Data Registry    Central Line      CVC insertion : NO                                               Perioperative temperature management      General/neuraxial anesthesia > or = 60 minutes (excluding CABG) : YES              > Use of intraoperative active warming : YES              > Temperature > or = 36 degrees Centigrade (96.8 degrees Farenheit) during time span from 30 minutes before up to 15 minutes after anesthesia end time : YES      Administration of antibiotic prophylaxis      Age > or = 18, with IV access, with surgical procedure for which antibiotic prophylaxis indicated, and not on chronic antibiotics : YES              > Prophylactic antibiotics within 1 hour of incision (or fluroroquinolone/vancomycin within 2 hours of incision) : YES    Medication Administration      Ordering or administration of drug inconsistent with intended drug, dose, delivery or timing : NO      Dental loss/damage      Dental injury with administration of anesthesia : NO      Difficult intubation due to unrecognized difficult airway        Elective airway procedure including but not limited to: tracheostomy, fiberoptic bronchoscopy, rigid bronchoscopy; jet ventilation; or elective use of a device to facilitate airway management such as a Glidescope : NO                > Unanticipated difficult intubation post pre-evaluation : NO      Aspiration of gastric contents        Aspiration of gastric contents : NO                    Surgical fire        Procedure requiring electrocautery/laser :  NO                    Immediate perioperative cardiac arrest        Cardiac arrest in OR or PACU : NO                    Unplanned hospital admission        Unplanned hospital admission for initially intended outpatient anesthesia service : NO      Unplanned ICU admission        Unplanned ICU admission related to anesthesia occurring within 24 hours of induction or start of MAC : NO      Surgical case cancellation  Cancellation of procedure after care already started by anesthesia care team : NO      Post-anesthesia transfer of care checklist/protocol to PACU        Transfer from OR to PACU upon case conclusion : YES              > Use of PACU transfer checklist/protocol : YES     (Includes the key elements of: patient identification, responsible practitioner identification (PACU nurse or advanced practitioner), discussion of pertinent history and procedure course, intraoperative anesthetic management, post-procedure plans, acknowledgement/questions)    Post-anesthesia transfer of care checklist/protocol to ICU        Transfer from OR to ICU upon case conclusion : NO                    Post-operative nausea/vomiting risk protocol        Post-operative nausea/vomiting risk protocol : YES  Patient > or = 18 with care initiated by anesthesia team that has a risk factor screen for post-op nausea/vomiting (Includes female, hx PONV, or motion sickness, non-smoker, intended opioid administration for post-op analgesia.)    Anaphylaxis        Anaphylaxis during anesthesia services : NO    (Inclusive of any suspected transfusion reaction in association with blood-bank confirmed blood product incompatibility)              Franne Grip, 12/30/2014 5:31 PM

## 2014-12-30 NOTE — Transfer of Care (Signed)
Anesthesia Transfer of Care Note    Patient: Lori Arnold    Procedures performed: Procedure(s) with comments:  ROBOT ASSISTED, LAPAROSCOPIC, SINGLE SITE, HYSTERECTOMY, TOTAL BSO WITH CYSTOSCOPY - ROBOT ASSISTED, LAPAROSCOPIC, SINGLE SITE, HYSTERECTOMY, TOTAL BSO WITH CYSTOSCOPY    Anesthesia type: General ETT    Patient location:Phase I PACU    Last vitals:   Filed Vitals:    12/30/14 1726   BP: 151/89   Pulse: 84   Temp: 36.5 C (97.7 F)   Resp: 12   SpO2: 100%       Post pain: Patient not complaining of pain, continue current therapy      Mental Status:awake    Respiratory Function: tolerating face mask    Cardiovascular: stable    Nausea/Vomiting: patient not complaining of nausea or vomiting    Hydration Status: adequate    Post assessment: no apparent anesthetic complicationsto rr with hob elevated, 02 via sfm at 6l/min,bright lights bother her eyes

## 2014-12-31 DIAGNOSIS — Z01818 Encounter for other preprocedural examination: Secondary | ICD-10-CM

## 2014-12-31 DIAGNOSIS — N950 Postmenopausal bleeding: Secondary | ICD-10-CM

## 2014-12-31 LAB — HEMOGLOBIN AND HEMATOCRIT, BLOOD
Hematocrit: 38.6 % (ref 37.0–47.0)
Hgb: 12.6 g/dL (ref 12.0–16.0)

## 2014-12-31 MED ORDER — IBUPROFEN 200 MG PO TABS
400.0000 mg | ORAL_TABLET | Freq: Four times a day (QID) | ORAL | Status: AC | PRN
Start: 2014-12-31 — End: 2015-01-10

## 2014-12-31 MED ORDER — OXYCODONE-ACETAMINOPHEN 5-325 MG PO TABS
1.0000 | ORAL_TABLET | ORAL | Status: AC | PRN
Start: 2014-12-31 — End: 2015-01-10

## 2014-12-31 NOTE — Discharge Summary (Signed)
Lori Arnold is a 64 y.o. female patient.   Operative Procedure:   Procedure(s):  ROBOT ASSISTED, LAPAROSCOPIC, SINGLE SITE, HYSTERECTOMY, TOTAL BSO WITH CYSTOSCOPY    Preoperative Diagnosis:   Pre-Op Diagnosis Codes:     * Hemorrhage, postmenopausal [N95.0]     * Uterine leiomyoma, unspecified location [D25.9]    Postoperative Diagnosis:   * No post-op diagnosis entered *    Anesthesia:   Choice    Estimated Blood Loss:        Input & Output:     Intake/Output Summary (Last 24 hours) at 12/31/14 1042  Last data filed at 12/31/14 1610   Gross per 24 hour   Intake   2580 ml   Output   2200 ml   Net    380 ml       Past Medical History   Diagnosis Date   . Arthritis    . Hypothyroidism      TAKES CYTOMEL & SYNTHROID   . Lupus      HAS ARTHRITIC TYPE SYMPTOMS   . Depression    . Anxiety    . Type 1 diabetes mellitus      PATIENT STATES TAKIN METFORMIN ONLY AS A PRECAUTION FOR DIABETES-FBS ON ARRIVAL     No past surgical history pertinent negatives on file.  Scheduled Meds:  Current Facility-Administered Medications   Medication Dose Route Frequency   . docusate sodium  100 mg Oral BID   . enoxaparin  30 mg Subcutaneous QAM   . ibuprofen  600 mg Oral Q6H   . levothyroxine  150 mcg Oral Daily at 0600     Continuous Infusions:  . lactated ringers 20 mL/hr at 12/30/14 1830     PRN Meds:morphine, ondansetron **OR** ondansetron, oxyCODONE-acetaminophen, zolpidem    No Known Allergies  Active Problems:    Fibroids    Blood pressure 115/64, pulse 80, temperature 98.7 F (37.1 C), temperature source Temporal Artery, resp. rate 18, height 1.626 m (5\' 4" ), weight 91.944 kg (202 lb 11.2 oz), SpO2 96 %.        Subjective  Objective      S:  Lori Arnold is Doing welll.  Tolerating PO     Pain controll  .  Ambulating   *     O:        VS: Patient Vitals for the past 24 hrs:   BP Temp Temp src Pulse Resp SpO2 Height Weight   12/31/14 0822 115/64 mmHg 98.7 F (37.1 C) Temporal Art 80 18 96 % - -   12/31/14 0351 144/67 mmHg  98.8 F (37.1 C) Temporal Art 84 18 98 % - -   12/30/14 2323 133/76 mmHg 99.1 F (37.3 C) Temporal Art 90 16 96 % - -   12/30/14 2102 135/76 mmHg 98.6 F (37 C) Temporal Art 94 16 97 % - -   12/30/14 2014 134/79 mmHg 99.1 F (37.3 C) Temporal Art - - - - -   12/30/14 1912 128/79 mmHg 97.6 F (36.4 C) - 82 - - - -   12/30/14 1821 132/79 mmHg 98.9 F (37.2 C) Temporal Art 80 17 99 % - -   12/30/14 1800 156/76 mmHg - - 80 14 94 % - -   12/30/14 1750 141/76 mmHg - - 80 18 95 % - -   12/30/14 1730 151/89 mmHg - - 80 16 99 % - -   12/30/14 1726 151/89 mmHg 97.7 F (36.5 C)  Temporal Art 84 12 100 % - -   12/30/14 1311 161/85 mmHg 99.2 F (37.3 C) - 85 18 95 % 1.626 m (5\' 4" ) 91.944 kg (202 lb 11.2 oz)       Exam:   General: no acute distress  Chest: clear to  auscultation  Abd: soft,  appropriately tender, + BS throughout     + Flatus.    Incision: clean/dry/intact   Ext: No edema, cords or tenderness   Assessment & Plan  A/P: s/p  Procedure(s):  ROBOT ASSISTED, LAPAROSCOPIC, SINGLE SITE, HYSTERECTOMY, TOTAL BSO WITH CYSTOSCOPY * No post-op diagnosis entered *  Discharge  Home TODAY   Followup  In office in 1  week   Nasirah, Sachs   Home Medication Instructions [id number]    Printed on:12/31/14 1042   Medication Information                      buPROPion XL (WELLBUTRIN XL) 300 MG 24 hr tablet  Take 300 mg by mouth every morning.             cetirizine (ZYRTEC) 10 MG chewable tablet  Chew 10 mg by mouth daily.             Cholecalciferol (VITAMIN D3) 5000 UNITS Tab  Take by mouth.             DHEA 25 MG Cap  Take by mouth.             Fesoterodine Fumarate (TOVIAZ PO)  Take 4 mg by mouth every morning.                fluticasone (FLONASE) 50 MCG/ACT nasal spray  1 spray by Nasal route as needed for Rhinitis.             folic acid (FOLVITE) 400 MCG tablet  Take 1 mg by mouth daily.                glucosamine-chondroitin 500-400 MG tablet  Take 1 tablet by mouth 3 (three) times daily.              hydroxychloroquine (PLAQUENIL) 200 MG tablet  Take by mouth 2 (two) times daily.                ibuprofen (ADVIL,MOTRIN) 200 MG tablet  Take 2 tablets (400 mg total) by mouth every 6 (six) hours as needed for Pain.             levothyroxine (SYNTHROID, LEVOTHROID) 50 MCG tablet  Take 150 mcg by mouth Once a day at 6:00am.                liothyronine (CYTOMEL) 5 MCG tablet  Take 5 mcg by mouth every morning.             metFORMIN (GLUCOPHAGE) 500 MG tablet  Take 500 mg by mouth every morning with breakfast.             methotrexate 2.5 MG tablet  Take 10 mg by mouth twice a week.             Mometasone Furo-Formoterol Fum 100-5 MCG/ACT Aerosol  Inhale into the lungs as needed.             oxyCODONE-acetaminophen (PERCOCET) 5-325 MG per tablet  Take 1 tablet by mouth every 4 (four) hours as needed for Pain.             PredniSONE 5  MG Tablet Delayed Response  Take by mouth every morning.             risedronate (ACTONEL) 150 MG tablet  Take 150 mg by mouth every 30 (thirty) days. with water on empty stomach, nothing by mouth or lie down for next 30 minutes.             venlafaxine (EFFEXOR-XR) 75 MG 24 hr capsule  TAKE 1 CAPSULE BY MOUTH EVERY DAY WITH FOOD               Assessment & Plan    Results     Procedure Component Value Units Date/Time    Hemoglobin and hematocrit, blood [[id number]] Collected:  12/31/14 0620    Specimen Information:  Blood Updated:  12/31/14 0629     Hgb 12.6 g/dL      Hematocrit 19.1 %     Narrative:      POD 1    Type and Screen [[id number]] Collected:  12/30/14 1308    Specimen Information:  Blood Updated:  12/30/14 1433     ABO Rh A POS      AB Screen Gel NEG     Glucose Whole Blood - POCT [[id number]]  (Abnormal) Collected:  12/30/14 1307     POCT - Glucose Whole blood 114 (H) mg/dL Updated:  96/29/52 8413        Radiology Results (24 Hour)     ** No results found for the last 24 hours. Orson Aloe, MD  12/31/2014

## 2014-12-31 NOTE — Progress Notes (Signed)
Patient up ambulating in hallway, she reports abdominal pain to be 8 out of 10. Patient medicated for pain, will continue to monitor.

## 2014-12-31 NOTE — Plan of Care (Signed)
Problem: Health Promotion  Goal: Vaccination Screening  All patients will be screened for current vaccination status on each admission.   Outcome: Completed Date Met:  12/31/14  Not indicated.  Goal: Risk control - tobacco abuse  Actions to eliminate or reduce tobacco use.   Outcome: Completed Date Met:  12/31/14  Non smoker.    Problem: Safety  Goal: Patient will be free from injury during hospitalization  Outcome: Progressing  Safety plan was reviewed.call bell within reach.will continue to monitor hourly.    Problem: Pain  Goal: Patient's pain/discomfort is manageable  Outcome: Progressing  Denies any pain or discomfort.will continue to monitor hourly.    Comments:   Pt is resting in bed comfortably.denies any pain or discomfort.no S/S of distress noted.dressing is intact and dry.small old drainage noted on the dressing.no S/S of bleeding or pus noted.BS is hypoactive.on regular diet.well tolerated it.encouraged pt to ambulate as tolerance.plan of care was reviewed.will continue to monitor hourly.

## 2014-12-31 NOTE — Discharge Instructions (Signed)
Discharge Orders:  Follow up in 7-10 days    Call Office   [phone]  For Appointment or  IF  Problems  Occur     Current Discharge Medication List      START taking these medications    Details   ibuprofen (ADVIL,MOTRIN) 200 MG tablet Take 2 tablets (400 mg total) by mouth every 6 (six) hours as needed for Pain.  Qty: 30 tablet, Refills: 0      oxyCODONE-acetaminophen (PERCOCET) 5-325 MG per tablet Take 1 tablet by mouth every 4 (four) hours as needed for Pain.  Qty: 20 tablet, Refills: 0         CONTINUE these medications which have NOT CHANGED    Details   buPROPion XL (WELLBUTRIN XL) 300 MG 24 hr tablet Take 300 mg by mouth every morning.      Cholecalciferol (VITAMIN D3) 5000 UNITS Tab Take by mouth.      DHEA 25 MG Cap Take by mouth.      Fesoterodine Fumarate (TOVIAZ PO) Take 4 mg by mouth every morning.         folic acid (FOLVITE) A999333 MCG tablet Take 1 mg by mouth daily.         glucosamine-chondroitin 500-400 MG tablet Take 1 tablet by mouth 3 (three) times daily.      hydroxychloroquine (PLAQUENIL) 200 MG tablet Take by mouth 2 (two) times daily.         levothyroxine (SYNTHROID, LEVOTHROID) 50 MCG tablet Take 150 mcg by mouth Once a day at 6:00am.         liothyronine (CYTOMEL) 5 MCG tablet Take 5 mcg by mouth every morning.      metFORMIN (GLUCOPHAGE) 500 MG tablet Take 500 mg by mouth every morning with breakfast.      methotrexate 2.5 MG tablet Take 10 mg by mouth twice a week.      PredniSONE 5 MG Tablet Delayed Response Take by mouth every morning.      risedronate (ACTONEL) 150 MG tablet Take 150 mg by mouth every 30 (thirty) days. with water on empty stomach, nothing by mouth or lie down for next 30 minutes.      venlafaxine (EFFEXOR-XR) 75 MG 24 hr capsule TAKE 1 CAPSULE BY MOUTH EVERY DAY WITH FOOD  Qty: 30 capsule, Refills: 5      cetirizine (ZYRTEC) 10 MG chewable tablet Chew 10 mg by mouth daily.      fluticasone (FLONASE) 50 MCG/ACT nasal spray 1 spray by Nasal route as needed for  Rhinitis.      Mometasone Furo-Formoterol Fum 100-5 MCG/ACT Aerosol Inhale into the lungs as needed.         STOP taking these medications       estradiol (VIVELLE-DOT) 0.0375 MG/24HR Comments:   Reason for Stopping:         HYDROcodone-acetaminophen (NORCO) 5-325 MG per tablet Comments:   Reason for Stopping:         Progesterone Micronized (PROGESTERONE PO) Comments:   Reason for Stopping:         testosterone (ANDRODERM) 2 MG/24HR Patch 24 hr Comments:   Reason for Stopping:  After your procedure-CALL OFFICE IF THESE PROBLEMS  OCCUR: [place]:[id number]     Fever - Oral temperature greater than 100.4 degrees Fahrenheit   Foul-smelling vaginal discharge   ABDOMINAL PAIN NOT RELIEVED BY  PAIN MEDS OR IF YOU ARE UNABLE TO PASS  FLATUS (GAS)   Headache unrelieved by "pain meds"   Difficulty urinating   Increased pain or  Drainage at  The incision sites   Difficulty breathing with or without chest pain   New calf pain especially if only on one side   Sudden, continuing increased vaginal bleeding with or without clots OR  HEAVY BLEEDING ( GREATER THAN A  PAD OR TAMPON /HOUR)    What to do at home:   Resume  Normal activity gradually   CONTINUE INCENTIVE SPIROMETRY AT HOME 3 TIMES A DAY  FOR AT LEAST 2-3 DAYS     May  Walk up  Stairs  Daily  As  Needed ( try to stay on one level for most  Of the day the first  Few days  Post-op)   No heavy lifting   No sex  OR TAMPONS until OK'd by your Physician   Take care of yourself by sleeping/resting as much as possible   Eat regular nutritious meals and     Take a multivitamin Daily   TAKE YOU TEMPERATURE  2-3 TIMES A DAY  FOR THE  FIRST 2 DAYS  AFTER SURGERY  Take pain medication as prescribed whenever you need them   Wear compression stockings if prescribed    To avoid/relieve constipation take stool softeners if advised    Drink lots of water/fruit juices and Increase fiber in your diet                     POST OPERATIVE INSTRUCTIONS   For  Robotic  Surgery including  Hysterectomy and  Other   Surgeries   PLEASE READ THESE INSTRUCTIONS IN REGARDS TO YOUR SURGERY    THIS ADDITIONAL DOCUMENT SHOULD HELP   ANSWER  MORE  SPECIFIC  QUESTIONS YOU MAY  HAVE ABOUT  YOUR  RECENT  SURGERY   IF HAVE  OTHER QUESTIONS  PLEASE DON'T HESITATE TO  CALL OUR OFFICE  [phone]       FAX [phone]      The providers and staff of [facility]  are committed to ensuring that your post-operative experience is as comfortable as possible. Please do not hesitate to call the office for any questions after recovery. The following information will help answer the frequently asked questions and will help you understand some of the common experiences that may occur after your surgery.    Patients should:    A. Call the office to schedule a post operative appointment 2 weeks after         your surgery.    B. Contact the office at [phone] for any problems after surgery.     C. If an ER visit is necessary postoperatively, always return to [facility]  where your surgery was performed.     What should I expect immediately after surgery?    1. Activity         a.    General. There are no standard limitations with regard to activity after  laparoscopic procedures except for driving and sexual activity (see   below). In general, use common sense when deciding what activities you  are willing to perform after surgery. Every patient is different, and different patients will have differing degrees of recovery. Gradually advance your activity. You should NOT be bedridden after these procedures. Continued movement and increased activity back to normal will prevent prolonged recovery times due to "detraining".    b. Average Recovery Times. Most patients leave the hospital the same  day, about 90% of patients leave the hospital the same day, and 10% will stay overnight at the hospital, usually  due to nausea or pain. The average time to achieve approximately 80% of normal activities is 2 weeks; the average time back to work is 7 to 10 days. You should be able to walk, eat, and drink the day after the surgery with mild to moderate discomfort. Please note that every patient is different, and the times stated above can vary from patient to patient.    c. Stairs. Apprehension about stairs or weakness in mobility requires help when climbing up and down stairs. You are allowed to use the stairs if you feel able.    d. Lifting. NO more than 5 -10 pounds post op     e. Exercise. This is highly encouraged after surgery, since it allows for faster return to normal function, and also helps with pain  Use common sense when starting an exercise  Go SLOW at  First  after surgery.  Abdominal exercises-You may resume these in 8 weeks after surgery but    Review this  With your  Doctor first  At your  2  Week follow up .  Cardio exercises-Start out slow and gradually increase time, distance and speed.Consult your Primary Care Physician if you have medical illnesses such as heart, lung, or other conditions.    f. Driving. Driving can begin only after you have stopped taking narcotics, for at least 24-48  Hours.You should always have some one with you first resume driving. If you are not hesitant about driving have someone drive you.                g. Sexual Function. Sexual intercourse should not be engaged in 2 weeks .  h. Bathing/Showering. You may take a shower the day after surgery. Tub baths should be avoided until your incisions are healed.    2. Bleeding  a. Incisions:   Bleeding at the incision sites is not uncommon. This can be from the incision itself, or may be a light red colored discharge from the adhesive barrier fluid..Regardless If bleeding persists for more than 2 days or is heavy, please call the office.    b. Vaginal. Vaginal bleeding or discharge can last up to six weeks, and is  usually light. This is  from the normal healing of the vaginal cuff. If bleeding becomes heavy, please inform the office immediately. Bleeding is heavy if it  fills a pad in an hour. You should not  Use  Tampons  After surgery     c. Urinary (Bladder) or Rectal  Bleeding is abnormal an you should  call.    3. Bruising  a. Incision. Some patients will develop bruises at the incision sites. The incision sites are made by "trocars", (tubes  That are inserted during surgery to allow [place] to  Move the operating instruments in and out of the abdomen.. Sometimes these trocars cut tiny vessels just beneath the skin that cause limited bleeding and a bruise may occur it  will resolve. Rarely, this bleeding can be very extensive, leading to  a large bruise this type of bleeding and bruise  almost always resolves. Pain or warmth may develop from the blood under the skin. Use Motrin 600 mg every 6 hours or 800 mg every 8 hours to relieve the pain.    4. Constipation  a. Percocet,NORCO , Tylenol 3, Dilaudid, Morphine,(all narcotics) will cause constipation that can be very severe. Pain medications such as IV morphine are often given directly after surgery in the recovery room. Because of this, you may develop constipation even though you never took any oral narcotic pain meds To prevent constipation, use a good laxative such as milk of magnesia, mineral oil, or other laxatives that work for you. Colace is not very effective, although pericolace works somewhat better. Remember that the more narcotics you use, the more constipation. The more constipation, the more pain, and the more narcotics you will require. This is a vicious cycle that can lead to severe constipation. Our recommendation is to start using laxatives immediately after surgery for at least 3 days to ensure that constipation does not develop. Milk of magnesia twice a day for three days is usually quite helpful.    5. Incisions  a. 5 mm incisions (1/4 inch). These incisions heal well, but can  develop small infections, bruising, or bleeding. The band aids can be removed in two days, the steri strips (small white strips) in 7 to 10 days. You should not have the strips in place at your post operative visit at two weeks.    b. 10 mm incisions (3/4 inch). These incisions are used to remove fibroids or ovarian masses. Please note that due to the increased size of the incision, it is common to have more pain, bleeding, or bruising with these incisions.    c. 4 to 5 cm incisions (2 to 2.5 inch). These incisions are located in the  [facility] , and are usually used for  [place] . Occasionally fluid collections develop under these incisions.    d. For any incision, if pain, bleeding, infection, or other problems persist, please call the office immediately.    e. All incisions are closed with absorbable suture (will dissolve on its own), and there is no need for removal.  6. Infections  a. Please note that hysterectomy type procedures are described as "clean contaminated". This means that the procedure can be complicated by infection from the vagina. The cervix is attached to the vagina, and removal of the uterus either with or without the cervix increases the risk of infection due to bacteria within the vagina. If your temperature at home is recorded at higher than 100.4, please call the office immediately. Some of the more common types of infection that can occur after surgery are listed below.    b. Vaginal. Some patients will develop a mild infection to the top of the vagina called a cellulitis or vaginal cuff infection.  Also note that if the antibiotics cause extensive nausea or you develop an allergic reaction to them, please discontinue the antibiotics and call the office. (ASSOCIATED WITH  HYSTERECTOMY)    c. Urinary Tract. These infections are relatively common after surgery due to catheterization of the bladder. You will not even know that you have been catheterized, since the catheter  is placed while you are sleep and removed before you awake. If you notice frequent urination, painful urination, or burning with urination, the infection is often treated very well with Cipro, an antibiotic prescription you will  receive before the surgery.    d. Incision. Infections to the skin also can occur, but are usually minor. Most of these infections can be treated with a topical antibiotic cream you can buy at the drug store. If the Incision area appears very red or is large, call the office. Note that the antibiotic Cipro is relatively effective against many causes of skin infections. You will receive a prescription for Cipro before the surgery.    7. Lung  a. If symptoms of shortness of breath develop after your surgery, please call the office immediately. Rarely, infection such as pneumonia or clots traveling to the lungs can cause these symptoms.    8. Nausea  a. Anesthesia. Anesthesia is the main cause for nausea immediately after surgery.Anti nausea medications are given after the procedure to prevent this. Some patients will experience nausea after the operation regardless. Although some patients will require admission due to nausea, it will resolve within 12 to 24 hours.    b. Constipation. Constipation is a major cause of nausea. Prevention by using a good laxative after surgery will prevent this (see Constipation above).      9. Pain.  a. Incision. Pain around the incision sites is not uncommon, and will resolve over several days. Most patients describe pain as minimal or moderate, and will improve daily.    b. Pelvic and Rectal. Some patients describe pressure and pain with urination or with bowel movements. These symptoms resolve and are due to irritation to the rectum and bladder from the surgical procedure, and will resolve with time.    c. Chest and Shoulder. The carbon dioxide gas used to insufflate the abdomen during the procedure (so the surgeon can see) will irritate the phrenic nerve in  some patients, leading to mild to severe pain. This nerve tracks pain impulses from the lining of the chest cavity. The pain can occur during deep breaths. This resolves within 24 to 48 hours, and is not worrisome. If the pain is extreme or does not resolve, a visit to the local ER is important to rule out other causes of chest pain, such as heart or lung issues.    d. Pain should resolve over time, and will get better every day. Overall pain in patients with laparoscopy is mild to moderate, and lasts for only one to two days. If pain persists or becomes worse, a visit to the ER at the hospital where the procedure was performed is recommended.    e. Pain Medications. You will be given a prescription for Motrin prior to surgery (start Motrin after surgery) and a narcotic (Percocet, Tylenol 3, or Vicodin) at the hospital prior to your discharge. To be effective Motrin should be used in doses of 600 mg every 6 hours, or 800 mg every 8 hours. Narcotics should be used sparingly since they will cause constipation. The first several days following surgery, most patients use mainly Motrin or extra strength Tylenol during the day, with use of a narcotic sometimes at night to help with sleep.    10. Swelling  a. Abdominal. Some degree of abdominal distension (swelling) is to be expected after surgery. This is due to distension of the intestines, and resolves over time. It is usually mild to moderate only.    b. Extremities. Swelling of the legs and sometimes arms is not uncommon after surgery. This is due to increased fluid given during the procedure. This will resolve over several days. If you notice persistent or increasing swelling,  tenderness to the calf or calf pain, please call the office immediately.        11. Urinary Retention  a. Urinary retention is the inability to pass urine through the bladder. A  very small number of patients will develop this problem due to the anesthetic used for the surgery. Most patients  will have their bladder catheter removed immediately after the surgery. If you are sent home and are not able to pass urine, please go to a local emergency room. A catheter will be placed to allow the bladder to "rest" after the surgery, and will be removed several days later in the office. It is important to have this catheter placed to avoid injury to the bladder.        Please call the office to schedule your post-operative visit for about 2 weeks after your surgery.    You may resume normal activities as soon as you feel able to do so. You should feel progressively stronger each day. Most patients are back to normal activity within one or two weeks.

## 2014-12-31 NOTE — Discharge Summary (Signed)
Pt is [place] home.[place] instruction and Rx paper was given to the pt who verbalized understanding.hep lock was removed.well tolerated it.belonging is with pt.pt was wheeled to the car.

## 2014-12-31 NOTE — Op Note (Signed)
Procedure Date: 12/30/2014     Patient Type: A     SURGEON: Orson Aloe MD  ASSISTANT:       FIRST ASSISTANT:  Mark from [place], along with PA student.     PREOPERATIVE DIAGNOSIS:  Postmenopausal bleeding along with thickened endometrial lining.     POSTOPERATIVE DIAGNOSES:  Postmenopausal bleeding along with thickened endometrial lining.     TITLE OF PROCEDURE:  Robotic hysterectomy with bilateral salpingo-oophorectomy.     ANESTHESIA:  General.     APPROXIMATE ESTIMATED BLOOD LOSS:  100 mL.     DESCRIPTION OF PROCEDURE:  After the patient was adequately induced under general anesthesia, draped  and prepped in the normal sterile fashion, we then placed the RUMI catheter  without difficulty and attention was directed to the abdomen.  The incision  was made along the umbilicus for allowing the [place], at which time we  placed without difficulty and docked the robot.  After having done that, we  then moved to the actual procedure.  Starting on the left side, we took  down the left tube. There was a small amount of bleeding just at the point  of proximal portion of the tube, however, that was controlled.  We  subsequently cauterized the IP ligament, staying far away from the ureter  and took down the ovary and tube on the left side.  We then moved to the  posterior cul-de-sac.  Because good visualization we did a quick posterior  colpotomy.  I then moved to the anterior bladder reflection, taking that  down, an  We filled the bladder to evaluate.  Having done that, we then  continued to mobilize the bladder down inferiorly to allow for good cuff  closure.  Having done that, we then opened up the anterior colpotomy, again  to help visualize and better delineate the uterine arteries.  After having  done that, we then continued to the right side, took down the right round  ligament, and also took down the right IP ligament, inclusive of the ovary  and tube.  Having done that, we then also visualize the ureter again  on  that side and stayed close to the adnexa as we approached the uterus.  Once  this was done, we then finished the bladder reflection and carried the  colpotomy on that side around in order to incorporate the right uterine  arteries and endopelvic fascia.  Once we completed this side, we then  continued to the backside.  On the left side we had some difficulty with  visualization and got into some bleeding, which was subsequently  controlled.  Subsequently, the uterus was removed without difficulty.  We  then reapproximated the cuff with 2-0 V-Loc suture in a continuous fashion,  doubling back to the midline for good reinforcement.  We did have some  difficulty again there because the bladder was somewhat redundant and the  bowel was quite close to the area of the incision.  However, we were  subsequently able to move the bowel with the help of the assistant and  closed the cuff adequately.  Blood loss was approximately 100 mL.  The  patient was subsequently awakened and transferred to the recovery room in  stable condition.           D:  12/31/2014 13:34 PM by Dr. Donovan Kail C. Tristan Schroeder, MD [id number])  T:  12/31/2014 17:34 PM by       [name]: [id number]) (Doc ID: [id number])

## 2015-01-03 ENCOUNTER — Encounter: Payer: Self-pay | Admitting: Obstetrics & Gynecology

## 2015-01-04 LAB — LAB USE ONLY - HISTORICAL SURGICAL PATHOLOGY

## 2015-01-06 ENCOUNTER — Encounter: Payer: Self-pay | Admitting: Obstetrics & Gynecology

## 2015-01-26 ENCOUNTER — Encounter (INDEPENDENT_AMBULATORY_CARE_PROVIDER_SITE_OTHER): Payer: Self-pay | Admitting: Internal Medicine

## 2015-01-27 ENCOUNTER — Other Ambulatory Visit (INDEPENDENT_AMBULATORY_CARE_PROVIDER_SITE_OTHER): Payer: Self-pay | Admitting: Internal Medicine

## 2015-02-09 ENCOUNTER — Ambulatory Visit (INDEPENDENT_AMBULATORY_CARE_PROVIDER_SITE_OTHER): Payer: PRIVATE HEALTH INSURANCE | Admitting: Family

## 2015-02-09 ENCOUNTER — Encounter (INDEPENDENT_AMBULATORY_CARE_PROVIDER_SITE_OTHER): Payer: Self-pay | Admitting: Family

## 2015-02-09 VITALS — BP 136/83 | HR 106 | Temp 98.3°F | Resp 17 | Wt 200.0 lb

## 2015-02-09 DIAGNOSIS — J019 Acute sinusitis, unspecified: Secondary | ICD-10-CM

## 2015-02-09 MED ORDER — AZITHROMYCIN 250 MG PO TABS
ORAL_TABLET | ORAL | Status: AC
Start: 2015-02-09 — End: 2015-02-14

## 2015-02-09 NOTE — Progress Notes (Signed)
URGENT  CARE    PROGRESS NOTE      Patient: Lori Arnold   Date: 02/09/2015   MRN: [id number]     Past Medical History   Diagnosis Date   . Arthritis    . Hypothyroidism      TAKES CYTOMEL & SYNTHROID   . Lupus      HAS ARTHRITIC TYPE SYMPTOMS   . Depression    . Anxiety    . Type 1 diabetes mellitus      PATIENT STATES TAKIN METFORMIN ONLY AS A PRECAUTION FOR DIABETES-FBS ON ARRIVAL     Social History     Social History   . Marital Status: Divorced     Spouse Name: N/A   . Number of Children: N/A   . Years of Education: N/A     Occupational History   . Not on file.     Social History Main Topics   . Smoking status: Former Smoker -- 1.00 packs/day for 25 years     Quit date: 12/09/1994   . Smokeless tobacco: Not on file   . Alcohol Use: 1.2 oz/week     2 Shots of liquor per week   . Drug Use: No   . Sexual Activity: No     Other Topics Concern   . Not on file     Social History Narrative     Family History   Problem Relation Age of Onset   . Heart disease Mother    . Cancer Father    . Heart disease Sister    . Heart disease Brother        ASSESSMENT/PLAN     Lori Arnold is a 64 y.o. female    Chief Complaint   Patient presents with   . Sinus Problem        1. Acute sinusitis, recurrence not specified, unspecified location  azithromycin (ZITHROMAX) 250 MG tablet          Patient Instructions     Sinusitis (Antibiotic Treatment)    The sinuses are air-filled spaces within the bones of the face. They connect to the inside of the nose.Sinusitisis an inflammation of the tissue lining the sinus cavity. Sinus inflammation can occur during a cold. It can also be due to allergies to pollens and other particles in the air. Sinusitis can cause symptoms of sinus congestion and fullness. A sinus infection causes fever, headache and facial pain. There is often green or yellow drainage from the nose or into the back of the throat (post-nasal drip). You have been given antibiotics to treat this condition.  Home  care:   Take the full course of antibiotics as instructed. Do not stop taking them, even if you feel better.   Drink plenty of water, hot tea, and other liquids. This may help thin mucus. It also may promote sinus drainage.   Heat may help soothe painful areas of the face. Use a towel soaked in hot water. Or, stand in the shower and direct the hot spray onto your face. Using a vaporizer along with a menthol rub at night may also help.   Anexpectorantcontaining guaifenesin may help thin the mucus and promote drainage from the sinuses.   Over-the-counterdecongestantsmay be used unless a similar medicine was prescribed. Nasal sprays work the fastest. Use one that contains phenylephrine or oxymetazoline. First blow the nose gently. Then use the spray. Do not use these medicines more often than directed on the label or  symptoms may get worse. You may also use tablets containing pseudoephedrine. Avoid products that combine ingredients, because side effects may be increased. Read labels. You can also ask the pharmacist for help. (NOTE:Persons with high blood pressure should not use decongestants. They can raise blood pressure.)   Over-the-counterantihistaminesmay help if allergies contributed to your sinusitis.    Do not use nasal rinses or irrigation during an acute sinus infection, unless told to by your health care provider. Rinsing may spread the infection to other sinuses.   Use acetaminophen or ibuprofen to control pain, unless another pain medicine was prescribed. (If you have chronic liver or kidney disease or ever had a stomach ulcer, talk with your doctor before using these medicines. Aspirin should never be used in anyone under 30 years of age who is ill with a fever. It may cause severe liver damage.)   Don't smoke. This can worsen symptoms.  Follow-up care  Follow up with your healthcare provider or our staff if you are not improving within the next week.  When to seek medical advice  Call  your healthcare provider if any of these occur:   Facial pain or headache becoming more severe   Stiff neck   Unusual drowsiness or confusion   Swelling of the forehead or eyelids   Vision problems, including blurred or double vision   Fever of100.58F (38C)or higher, or as directed by your healthcare provider   Seizure   Breathing problems   Symptoms not resolving within 10 days   2000-2015 [employer] [address], [place], [place] [address]. All rights reserved. This information is not intended as a substitute for professional medical care. Always follow your healthcare professional's instructions.              Results for orders placed or performed during the hospital encounter of 12/30/14   Hemoglobin and hematocrit, blood   Result Value Ref Range    Hgb 12.6 12.0 - 16.0 g/dL    Hematocrit 60.4 54.0 - 47.0 %   Glucose Whole Blood - POCT   Result Value Ref Range    POCT - Glucose Whole blood 114 (H) 70 - 100 mg/dL   Type and Screen   Result Value Ref Range    ABO Rh A POS     AB Screen Gel NEG        Risk & Benefits of the new medication(s) were explained to the patient (and family) who verbalized understanding & agreed to the treatment plan. Patient (family) encouraged to contact me/clinical staff with any questions/concerns      MEDICATIONS     Current Outpatient Prescriptions   Medication Sig Dispense Refill   . buPROPion XL (WELLBUTRIN XL) 300 MG 24 hr tablet TAKE 1 TABLET BY MOUTH EVERY DAY IN THE MORNING 30 tablet 5   . cetirizine (ZYRTEC) 10 MG chewable tablet Chew 10 mg by mouth daily.     . Cholecalciferol (VITAMIN D3) 5000 UNITS Tab Take by mouth.     Marland Kitchen DHEA 25 MG Cap Take by mouth.     . Fesoterodine Fumarate (TOVIAZ PO) Take 4 mg by mouth every morning.        . fluticasone (FLONASE) 50 MCG/ACT nasal spray 1 spray by Nasal route as needed for Rhinitis.     . folic acid (FOLVITE) 400 MCG tablet Take 1 mg by mouth daily.        Marland Kitchen glucosamine-chondroitin 500-400 MG tablet  Take 1 tablet by  mouth 3 (three) times daily.     . hydroxychloroquine (PLAQUENIL) 200 MG tablet Take by mouth 2 (two) times daily.        Marland Kitchen levothyroxine (SYNTHROID, LEVOTHROID) 50 MCG tablet Take 150 mcg by mouth Once a day at 6:00am.        . liothyronine (CYTOMEL) 5 MCG tablet Take 5 mcg by mouth every morning.     . metFORMIN (GLUCOPHAGE) 500 MG tablet Take 500 mg by mouth every morning with breakfast.     . methotrexate 2.5 MG tablet Take 10 mg by mouth twice a week.     . Mometasone Furo-Formoterol Fum 100-5 MCG/ACT Aerosol Inhale into the lungs as needed.     . risedronate (ACTONEL) 150 MG tablet Take 150 mg by mouth every 30 (thirty) days. with water on empty stomach, nothing by mouth or lie down for next 30 minutes.     . venlafaxine (EFFEXOR-XR) 75 MG 24 hr capsule TAKE 1 CAPSULE BY MOUTH EVERY DAY WITH FOOD 30 capsule 5   . PredniSONE 5 MG Tablet Delayed Response Take by mouth every morning.       No current facility-administered medications for this visit.       No Known Allergies    SUBJECTIVE     Chief Complaint   Patient presents with   . Sinus Problem        Sinus Problem  This is a new problem. Episode onset: "a couple days" The problem has been gradually worsening since onset. There has been no fever. The pain is moderate. Associated symptoms include congestion, coughing, ear pain (right ear), headaches, a hoarse voice, neck pain, sinus pressure, a sore throat and swollen glands. Pertinent negatives include no chills, shortness of breath or sneezing. Treatments tried: zyrtec. The treatment provided no relief.       ROS     Review of Systems   Constitutional: Positive for fatigue. Negative for fever and chills.   HENT: Positive for congestion, ear pain (right ear), hoarse voice, sinus pressure and sore throat. Negative for sneezing.    Respiratory: Positive for cough. Negative for shortness of breath and wheezing.    Cardiovascular: Negative for chest pain and palpitations.   Gastrointestinal:  Negative for nausea, vomiting and abdominal pain.   Musculoskeletal: Positive for neck pain.   Skin: Negative for rash.   Allergic/Immunologic: Positive for environmental allergies.   Neurological: Positive for headaches.       The following portions of the patient's history were reviewed and updated as appropriate: Allergies, Current Medications, Past Family History, Past Medical history, Past social history, Past surgical history, and Problem List.    PHYSICAL EXAM     Filed Vitals:    02/09/15 1133   BP: 136/83   Pulse: 106   Temp: 98.3 F (36.8 C)   TempSrc: Oral   Resp: 17   Weight: 90.719 kg (200 lb)   SpO2: 91%       Physical Exam   Constitutional: She is oriented to person, place, and time. She appears well-developed and well-nourished.   HENT:   Head: Normocephalic and atraumatic.   Right Ear: External ear normal.   Left Ear: External ear normal.   Nose: Mucosal edema present. No rhinorrhea. Right sinus exhibits maxillary sinus tenderness and frontal sinus tenderness. Left sinus exhibits maxillary sinus tenderness and frontal sinus tenderness.   Mouth/Throat: Oropharynx is clear and moist.   Neck: Normal range of motion. Neck supple.   Cardiovascular: Normal  rate, regular rhythm and normal heart sounds.    Pulmonary/Chest: Effort normal and breath sounds normal. She has no wheezes.   Lymphadenopathy:     She has no cervical adenopathy.   Neurological: She is alert and oriented to person, place, and time.   Skin: Skin is warm and dry.   Psychiatric: She has a normal mood and affect. Her behavior is normal. Judgment and thought content normal.             Signed,  Joanathan Affeldt Lescault FNP-C  Supervising physician: Dr. Zorita Pang, MD was not present during this visit.

## 2015-02-09 NOTE — Patient Instructions (Signed)
Sinusitis (Antibiotic Treatment)    The sinuses are air-filled spaces within the bones of the face. They connect to the inside of the nose.Sinusitisis an inflammation of the tissue lining the sinus cavity. Sinus inflammation can occur during a cold. It can also be due to allergies to pollens and other particles in the air. Sinusitis can cause symptoms of sinus congestion and fullness. A sinus infection causes fever, headache and facial pain. There is often green or yellow drainage from the nose or into the back of the throat (post-nasal drip). You have been given antibiotics to treat this condition.  Home care:   Take the full course of antibiotics as instructed. Do not stop taking them, even if you feel better.   Drink plenty of water, hot tea, and other liquids. This may help thin mucus. It also may promote sinus drainage.   Heat may help soothe painful areas of the face. Use a towel soaked in hot water. Or, stand in the shower and direct the hot spray onto your face. Using a vaporizer along with a menthol rub at night may also help.   Anexpectorantcontaining guaifenesin may help thin the mucus and promote drainage from the sinuses.   Over-the-counterdecongestantsmay be used unless a similar medicine was prescribed. Nasal sprays work the fastest. Use one that contains phenylephrine or oxymetazoline. First blow the nose gently. Then use the spray. Do not use these medicines more often than directed on the label or symptoms may get worse. You may also use tablets containing pseudoephedrine. Avoid products that combine ingredients, because side effects may be increased. Read labels. You can also ask the pharmacist for help. (NOTE:Persons with high blood pressure should not use decongestants. They can raise blood pressure.)   Over-the-counterantihistaminesmay help if allergies contributed to your sinusitis.    Do not use nasal rinses or irrigation during an acute sinus infection, unless told to by  your health care provider. Rinsing may spread the infection to other sinuses.   Use acetaminophen or ibuprofen to control pain, unless another pain medicine was prescribed. (If you have chronic liver or kidney disease or ever had a stomach ulcer, talk with your doctor before using these medicines. Aspirin should never be used in anyone under 18 years of age who is ill with a fever. It may cause severe liver damage.)   Don't smoke. This can worsen symptoms.  Follow-up care  Follow up with your healthcare provider or our staff if you are not improving within the next week.  When to seek medical advice  Call your healthcare provider if any of these occur:   Facial pain or headache becoming more severe   Stiff neck   Unusual drowsiness or confusion   Swelling of the forehead or eyelids   Vision problems, including blurred or double vision   Fever of100.4F (38C)or higher, or as directed by your healthcare provider   Seizure   Breathing problems   Symptoms not resolving within 10 days   2000-2015 [employer] [address], [place] [address]. All rights reserved. This information is not intended as a substitute for professional medical care. Always follow your healthcare professional's instructions.

## 2015-02-20 ENCOUNTER — Other Ambulatory Visit (INDEPENDENT_AMBULATORY_CARE_PROVIDER_SITE_OTHER): Payer: Self-pay | Admitting: Internal Medicine

## 2015-02-23 ENCOUNTER — Encounter (INDEPENDENT_AMBULATORY_CARE_PROVIDER_SITE_OTHER): Payer: Self-pay | Admitting: Internal Medicine

## 2015-03-11 ENCOUNTER — Encounter (INDEPENDENT_AMBULATORY_CARE_PROVIDER_SITE_OTHER): Payer: Self-pay | Admitting: Internal Medicine

## 2015-03-11 ENCOUNTER — Ambulatory Visit (INDEPENDENT_AMBULATORY_CARE_PROVIDER_SITE_OTHER): Payer: PRIVATE HEALTH INSURANCE | Admitting: Internal Medicine

## 2015-03-11 VITALS — BP 148/86 | HR 102 | Temp 98.8°F | Resp 16 | Ht 64.0 in | Wt 207.0 lb

## 2015-03-11 DIAGNOSIS — Z23 Encounter for immunization: Secondary | ICD-10-CM

## 2015-03-11 DIAGNOSIS — R7303 Prediabetes: Secondary | ICD-10-CM

## 2015-03-11 DIAGNOSIS — E039 Hypothyroidism, unspecified: Secondary | ICD-10-CM

## 2015-03-11 DIAGNOSIS — M329 Systemic lupus erythematosus, unspecified: Secondary | ICD-10-CM

## 2015-03-11 DIAGNOSIS — IMO0002 Reserved for concepts with insufficient information to code with codable children: Secondary | ICD-10-CM | POA: Insufficient documentation

## 2015-03-11 DIAGNOSIS — R7309 Other abnormal glucose: Secondary | ICD-10-CM

## 2015-03-11 NOTE — Progress Notes (Signed)
Subjective:      Date: 03/11/2015 2:36 PM   Patient ID: Lori Arnold is a 64 y.o. female.    Chief Complaint:  Chief Complaint   Patient presents with   . post op after hysterectomy       HPI:  HPI Comments: Pt here for follow from post op.  Pt had hysterectomy on 01/05/2015.  Pt has no concerns.   Pt had episode of vaginal bleeding - post-menopausal.  Pelvic U/S demonstrated increased thickness of lining of uterus.  Biopsy negative for malignancy.  Laparoscopic hysterectomy with BSO performed.   In addition, pt has h/o hypothyroidism - compliant with current regimen.  Denies side effects to therapy.  Denies any change in hair/nails/skin, constipation, unexplained wt changes.  Prior TFT's in 2016 demonstrated supratherapeutic treatment of hypothyroidism.  Pt has has h/o prediabetes and has been attempting to modify diet.      Problem List:  Patient Active Problem List   Diagnosis   . Fibroids   . Prediabetes   . Lupus   . Hypothyroidism       Current Medications:  Current Outpatient Prescriptions   Medication Sig Dispense Refill   . buPROPion XL (WELLBUTRIN XL) 300 MG 24 hr tablet TAKE 1 TABLET BY MOUTH EVERY DAY IN THE MORNING 30 tablet 5   . cetirizine (ZYRTEC) 10 MG chewable tablet Chew 10 mg by mouth daily.     . Cholecalciferol (VITAMIN D3) 5000 UNITS Tab Take by mouth.     Marland Kitchen DHEA 25 MG Cap Take by mouth.     . Fesoterodine Fumarate (TOVIAZ PO) Take 4 mg by mouth every morning.        . fluticasone (FLONASE) 50 MCG/ACT nasal spray 1 spray by Nasal route as needed for Rhinitis.     . folic acid (FOLVITE) 1 MG tablet      . glucosamine-chondroitin 500-400 MG tablet Take 1 tablet by mouth 3 (three) times daily.     . hydroxychloroquine (PLAQUENIL) 200 MG tablet Take by mouth 2 (two) times daily.        Marland Kitchen levothyroxine (SYNTHROID, LEVOTHROID) 150 MCG tablet 150 mcg daily.        Marland Kitchen liothyronine (CYTOMEL) 5 MCG tablet Take 5 mcg by mouth every morning.     . methotrexate 2.5 MG tablet Take 10 mg by mouth twice a  week.     . Mometasone Furo-Formoterol Fum 100-5 MCG/ACT Aerosol Inhale into the lungs as needed.     . risedronate (ACTONEL) 150 MG tablet Take 150 mg by mouth every 30 (thirty) days. with water on empty stomach, nothing by mouth or lie down for next 30 minutes.     . tolterodine (DETROL LA) 4 MG 24 hr capsule      . valacyclovir (VALTREX) 1000 MG tablet TAKE 1 TABLET BY MOUTH EVERY 12 HOURS FOR 7 DAYS 14 tablet 5   . venlafaxine (EFFEXOR-XR) 75 MG 24 hr capsule TAKE 1 CAPSULE BY MOUTH EVERY DAY WITH FOOD 30 capsule 5   . metFORMIN (GLUCOPHAGE-XR) 500 MG 24 hr tablet        No current facility-administered medications for this visit.       Allergies:  No Known Allergies    Past Medical History:  Past Medical History   Diagnosis Date   . Arthritis    . Hypothyroidism      TAKES CYTOMEL & SYNTHROID   . Lupus      HAS ARTHRITIC TYPE SYMPTOMS   .  Depression    . Anxiety    . Prediabetes        Past Surgical History:  Past Surgical History   Procedure Laterality Date   . Trigger finger release Bilateral [address] & 2014   . Colonoscopy  06/2013   . Tonsillectomy  1959   . Dilation & curretage  1984     AFTER A STILLBORN   . De quervain's release Bilateral 1988   . Robot assisted, laparoscopic, single site, hysterectomy, total, bso  N/A 12/30/2014     Procedure: ROBOT ASSISTED, LAPAROSCOPIC, SINGLE SITE, HYSTERECTOMY, TOTAL BSO WITH CYSTOSCOPY;  Surgeon: Orson Aloe, MD;  Location: [facility];  Service: Gynecology;  Laterality: N/A;  ROBOT ASSISTED, LAPAROSCOPIC, SINGLE SITE, HYSTERECTOMY, TOTAL BSO WITH CYSTOSCOPY       Family History:  Family History   Problem Relation Age of Onset   . Heart disease Mother    . Cancer Father    . Heart disease Sister    . Heart disease Brother        Social History:  Social History     Social History   . Marital Status: Divorced     Spouse Name: N/A   . Number of Children: N/A   . Years of Education: N/A     Occupational History   . Not on file.     Social History Main Topics   .  Smoking status: Former Smoker -- 1.00 packs/day for 25 years     Quit date: 12/09/1994   . Smokeless tobacco: Not on file   . Alcohol Use: 1.2 oz/week     2 Shots of liquor per week   . Drug Use: No   . Sexual Activity: No     Other Topics Concern   . Not on file     Social History Narrative       The following portions of the patient's history were reviewed and updated as appropriate: allergies, current medications, past family history, past medical history, past social history, past surgical history and problem list.    Vitals:  BP 148/86 mmHg  Pulse 102  Temp(Src) 98.8 F (37.1 C) (Oral)  Resp 16  Ht 1.626 m (5\' 4" )  Wt 93.895 kg (207 lb)  BMI 35.51 kg/m2  SpO2 96%   ROS:  General/Constitutional:   Denies Chills. Denies Fatigue. Denies Fever.   Ophthalmologic:   Denies Blurred vision.   ENT:   Denies Nasal Discharge. Denies Sinus pain. Denies Sore throat.   Respiratory:   Denies Cough. Denies Shortness of breath. Denies Wheezing.   Cardiovascular:   Denies Chest pain. Denies Chest pain with exertion. Denies Palpitations. Denies  Swelling in hands/feet.   Gastrointestinal:   Denies Abdominal pain. Denies Constipation. Denies Diarrhea. Denies Nausea. Denies  Vomiting.   Skin:   Denies Rash.   Neurologic:   Denies Dizziness. Denies Headache. Denies Tingling/Numbness.     The following portions of the patient's history were reviewed and updated as appropriate: Allergies, Current Medications, Past Family History, Past Medical history, Past social history, Past surgical history, and Problem List.        Objective:       Physical Exam:  General Examination:   GENERAL APPEARANCE: alert, in no acute distress, well developed, well nourished,  oriented to time, place, and person.   ORAL CAVITY: normal oropharynx, normal lips, mucosa moist.   THROAT: normal appearance, clear.   NECK/THYROID: neck supple, no carotid bruit, carotid pulse 2+  bilaterally, no cervical  lymphadenopathy, no neck mass palpated, no jugular  venous distention, no  thyromegaly.   HEART: S1, S2 normal, no murmurs, rubs, gallops, regular rate and rhythm.   LUNGS: normal effort / no distress, normal breath sounds, clear to auscultation  bilaterally, no wheezes, rales, rhonchi.   EXTREMITIES: no clubbing, cyanosis, or edema bilaterally.   PERIPHERAL PULSES: 2+ dorsalis pedis, 2+ posterior tibial bilaterally.   NEUROLOGIC: alert and oriented.       Assessment:       1. Acquired hypothyroidism  - T4, free  - T3, free  - TSH    2. Prediabetes  - Hemoglobin A1C    3. Lupus    4. Need for prophylactic vaccination and inoculation against influenza  - Flu vaccine 18 - 64 years ([id number])        Plan:     Hypothyroidism:  Thyroid function tests indicate supra-therapeutic treatment with thyroid replacement therapy at last visit.  Recommend obtain TFT's    Prediabetes:  Monitor A1C indices - optimize low carbohydrate diet and exercise measures    SLE:  Stable on current regimen.  Rheumatology surveillance is UTD      Sandford Craze, MD

## 2015-03-12 LAB — T4, FREE: T4, Free: 1.77 ng/dL (ref 0.82–1.77)

## 2015-03-12 LAB — T3, FREE: T3, Free: 3.1 pg/mL (ref 2.0–4.4)

## 2015-03-12 LAB — TSH: TSH: 0.01 u[IU]/mL — ABNORMAL LOW (ref 0.450–4.500)

## 2015-03-12 LAB — HEMOGLOBIN A1C: Hemoglobin A1C: 6 % — ABNORMAL HIGH (ref 4.8–5.6)

## 2015-03-25 ENCOUNTER — Encounter (INDEPENDENT_AMBULATORY_CARE_PROVIDER_SITE_OTHER): Payer: Self-pay | Admitting: Internal Medicine

## 2015-04-30 ENCOUNTER — Other Ambulatory Visit (INDEPENDENT_AMBULATORY_CARE_PROVIDER_SITE_OTHER): Payer: Self-pay | Admitting: Internal Medicine

## 2015-05-02 ENCOUNTER — Encounter (INDEPENDENT_AMBULATORY_CARE_PROVIDER_SITE_OTHER): Payer: Self-pay | Admitting: Internal Medicine

## 2015-05-03 MED ORDER — LIOTHYRONINE SODIUM 5 MCG PO TABS
5.0000 ug | ORAL_TABLET | Freq: Every day | ORAL | Status: DC
Start: 2015-05-03 — End: 2015-12-10

## 2015-05-03 MED ORDER — METFORMIN HCL ER 500 MG PO TB24
500.0000 mg | ORAL_TABLET | Freq: Every morning | ORAL | Status: DC
Start: 2015-05-03 — End: 2016-05-03

## 2015-05-03 NOTE — Telephone Encounter (Signed)
Patient was last seen on 03/11/15 with a plan to follow up in 6 months. Labs were also done on 03/11/15. Patient has no pending appt. Would you like to send these prescriptions? Please advise. Thank you.

## 2015-05-03 NOTE — Telephone Encounter (Signed)
Scripts signed and sent to pharmacy on file

## 2015-05-12 ENCOUNTER — Encounter (INDEPENDENT_AMBULATORY_CARE_PROVIDER_SITE_OTHER): Payer: Self-pay | Admitting: Internal Medicine

## 2015-05-12 ENCOUNTER — Ambulatory Visit (INDEPENDENT_AMBULATORY_CARE_PROVIDER_SITE_OTHER): Payer: PRIVATE HEALTH INSURANCE | Admitting: Internal Medicine

## 2015-05-12 VITALS — BP 118/73 | HR 82 | Temp 97.8°F | Resp 16 | Ht 64.0 in | Wt 199.0 lb

## 2015-05-12 DIAGNOSIS — T148XXA Other injury of unspecified body region, initial encounter: Secondary | ICD-10-CM

## 2015-05-12 DIAGNOSIS — T148 Other injury of unspecified body region: Secondary | ICD-10-CM

## 2015-05-12 MED ORDER — ERYTHROMYCIN 5 MG/GM OP OINT
TOPICAL_OINTMENT | OPHTHALMIC | Status: DC
Start: 2015-05-12 — End: 2015-07-18

## 2015-05-12 NOTE — Progress Notes (Signed)
Subjective:      Date: 05/12/2015 10:24 AM   Patient ID: Lori Arnold is a 64 y.o. female.    Chief Complaint:  Chief Complaint   Patient presents with   . Eye Pain       HPI:  Eye Pain   The right eye is affected. This is a new problem. Episode onset: 2 days. The problem occurs constantly. There was no injury mechanism. The pain is mild. There is no known exposure to pink eye. She does not wear contacts. Associated symptoms include blurred vision, an eye discharge and eye redness. Pertinent negatives include no double vision, fever, foreign body sensation, itching, nausea, photophobia or recent URI. She has tried nothing for the symptoms.   started as pimple on the lid. Pt squeezed it, had puslike d/c. Now is larger and more tender    Problem List:  Patient Active Problem List   Diagnosis   . Fibroids   . Prediabetes   . Lupus   . Hypothyroidism       Current Medications:  Current Outpatient Prescriptions   Medication Sig Dispense Refill   . buPROPion XL (WELLBUTRIN XL) 300 MG 24 hr tablet TAKE 1 TABLET BY MOUTH EVERY DAY IN THE MORNING 30 tablet 5   . cetirizine (ZYRTEC) 10 MG chewable tablet Chew 10 mg by mouth daily.     . Cholecalciferol (VITAMIN D3) 5000 UNITS Tab Take by mouth.     Marland Kitchen DHEA 25 MG Cap Take by mouth.     . Fesoterodine Fumarate (TOVIAZ PO) Take 4 mg by mouth every morning.        . fluticasone (FLONASE) 50 MCG/ACT nasal spray 1 spray by Nasal route as needed for Rhinitis.     . folic acid (FOLVITE) 1 MG tablet      . glucosamine-chondroitin 500-400 MG tablet Take 1 tablet by mouth 3 (three) times daily.     . hydroxychloroquine (PLAQUENIL) 200 MG tablet Take by mouth 2 (two) times daily.        Marland Kitchen levothyroxine (SYNTHROID, LEVOTHROID) 150 MCG tablet TAKE 1 TABLET BY MOUTH EVERY DAY 30 tablet 5   . liothyronine (CYTOMEL) 5 MCG tablet Take 5 mcg by mouth every morning.     . metFORMIN (GLUCOPHAGE XR) 500 MG 24 hr tablet Take 1 tablet (500 mg total) by mouth every morning with breakfast. 30 tablet  5   . methotrexate 2.5 MG tablet Take 10 mg by mouth twice a week.     . Mometasone Furo-Formoterol Fum 100-5 MCG/ACT Aerosol Inhale into the lungs as needed.     . risedronate (ACTONEL) 150 MG tablet Take 150 mg by mouth every 30 (thirty) days. with water on empty stomach, nothing by mouth or lie down for next 30 minutes.     . tolterodine (DETROL LA) 4 MG 24 hr capsule TAKE 1 CAPSULE BY MOUTH EVERY DAY 30 capsule 6   . valacyclovir (VALTREX) 1000 MG tablet TAKE 1 TABLET BY MOUTH EVERY 12 HOURS FOR 7 DAYS 14 tablet 5   . venlafaxine (EFFEXOR-XR) 75 MG 24 hr capsule TAKE 1 CAPSULE BY MOUTH EVERY DAY WITH FOOD 30 capsule 5     No current facility-administered medications for this visit.       Allergies:  No Known Allergies    Past Medical History:  Past Medical History   Diagnosis Date   . Arthritis    . Hypothyroidism      TAKES CYTOMEL & SYNTHROID   .  Lupus      HAS ARTHRITIC TYPE SYMPTOMS   . Depression    . Anxiety    . Prediabetes        Past Surgical History:  Past Surgical History   Procedure Laterality Date   . Trigger finger release Bilateral 52841 & 2014   . Colonoscopy  06/2013   . Tonsillectomy  1959   . Dilation & curretage  1984     AFTER A STILLBORN   . De quervain's release Bilateral 1988   . Robot assisted, laparoscopic, single site, hysterectomy, total, bso  N/A 12/30/2014     Procedure: ROBOT ASSISTED, LAPAROSCOPIC, SINGLE SITE, HYSTERECTOMY, TOTAL BSO WITH CYSTOSCOPY;  Surgeon: Orson Aloe, MD;  Location: [facility];  Service: Gynecology;  Laterality: N/A;  ROBOT ASSISTED, LAPAROSCOPIC, SINGLE SITE, HYSTERECTOMY, TOTAL BSO WITH CYSTOSCOPY       Family History:  Family History   Problem Relation Age of Onset   . Heart disease Mother    . Cancer Father    . Heart disease Sister    . Heart disease Brother        Social History:  Social History     Social History   . Marital Status: Divorced     Spouse Name: N/A   . Number of Children: N/A   . Years of Education: N/A     Occupational History    . Not on file.     Social History Main Topics   . Smoking status: Former Smoker -- 1.00 packs/day for 25 years     Quit date: 12/09/1994   . Smokeless tobacco: Not on file   . Alcohol Use: 1.2 oz/week     2 Shots of liquor per week   . Drug Use: No   . Sexual Activity: No     Other Topics Concern   . Not on file     Social History Narrative       The following portions of the patient's history were reviewed and updated as appropriate: allergies, current medications, past family history, past medical history, past social history, past surgical history and problem list.    Vitals:  BP 118/73 mmHg  Pulse 82  Temp(Src) 97.8 F (36.6 C)  Resp 16  Ht 1.626 m (5\' 4" )  Wt 90.266 kg (199 lb)  BMI 34.14 kg/m2  SpO2 95%   Review of Systems   Constitutional: Positive for chills. Negative for fever.   Eyes: Positive for blurred vision, pain, discharge and redness. Negative for double vision and photophobia.   Gastrointestinal: Negative for nausea.   Skin: Negative for itching.   General Examination:   GENERAL APPEARANCE: alert, in no acute distress, well developed, well nourished, oriented to time, place, and person.   HEAD: normal appearance, atraumatic.   EYES: extraocular movement intact (EOMI), pupils equal, round, reactive to light and accommodation, sclera anicteric, conjunctiva clear. Right lower lid mildly erythematous and swollen. Abrasion present inner lower lid.  NEUROLOGIC: nonfocal, cranial nerves 2-12 grossly intact  PSYCH: cognitive function intact, mood/affect full range, speech clear.    1. Abrasion  - erythromycin (ROMYCIN) ophthalmic ointment; Apply 1/2 inch ribbon of ointment to affected eye Q4hrs  Dispense: 3.5 g; Refill: 0  warm compresses  Pt will f/u with her ophthalmologist if symptoms persist or worsen    Beacher May, MD

## 2015-05-25 ENCOUNTER — Other Ambulatory Visit (INDEPENDENT_AMBULATORY_CARE_PROVIDER_SITE_OTHER): Payer: Self-pay

## 2015-05-25 MED ORDER — LEVOTHYROXINE SODIUM 137 MCG PO TABS
137.0000 ug | ORAL_TABLET | Freq: Every morning | ORAL | Status: DC
Start: 2015-05-25 — End: 2015-07-26

## 2015-05-25 NOTE — Telephone Encounter (Signed)
-----   Message from Sandford Craze, MD sent at 03/12/2015  5:18 PM EDT -----  Please call patient with the following results:  The following test results are abnormal: The A1C ( 3 month measure of glucose control ) is elevated at "pre-diabetic" levels. The following labs demonstrate supra-therapeutic treatment of  hypothyroidism ("too much thyroid meds"). Recommend decreasing dose to: Levothyroxine 137 mcg one PO daily #30 Rf x 5 . Repeat thyroid function testing ( TSH, FT4, TT3, Free T3 ) in 3 months.  Continue to optimize low carbohydrate diet and aerobic exercise measures.

## 2015-05-25 NOTE — Telephone Encounter (Signed)
Patient advised of lab results and provider recommendations. Patient verbalized comprehension. Dr. Lula Olszewski, can you please send med? Thank you.

## 2015-05-25 NOTE — Telephone Encounter (Signed)
Scripts signed and sent to pharmacy on file

## 2015-06-04 ENCOUNTER — Other Ambulatory Visit (INDEPENDENT_AMBULATORY_CARE_PROVIDER_SITE_OTHER): Payer: Self-pay | Admitting: Internal Medicine

## 2015-06-17 ENCOUNTER — Encounter (INDEPENDENT_AMBULATORY_CARE_PROVIDER_SITE_OTHER): Payer: Self-pay | Admitting: Internal Medicine

## 2015-07-01 ENCOUNTER — Encounter (INDEPENDENT_AMBULATORY_CARE_PROVIDER_SITE_OTHER): Payer: Self-pay | Admitting: Family

## 2015-07-07 ENCOUNTER — Encounter (INDEPENDENT_AMBULATORY_CARE_PROVIDER_SITE_OTHER): Payer: Self-pay | Admitting: Internal Medicine

## 2015-07-08 ENCOUNTER — Other Ambulatory Visit (INDEPENDENT_AMBULATORY_CARE_PROVIDER_SITE_OTHER): Payer: Self-pay | Admitting: Internal Medicine

## 2015-07-08 DIAGNOSIS — E039 Hypothyroidism, unspecified: Secondary | ICD-10-CM

## 2015-07-08 DIAGNOSIS — R7303 Prediabetes: Secondary | ICD-10-CM

## 2015-07-11 ENCOUNTER — Encounter (INDEPENDENT_AMBULATORY_CARE_PROVIDER_SITE_OTHER): Payer: Self-pay | Admitting: Internal Medicine

## 2015-07-13 ENCOUNTER — Ambulatory Visit (INDEPENDENT_AMBULATORY_CARE_PROVIDER_SITE_OTHER): Payer: PRIVATE HEALTH INSURANCE

## 2015-07-13 ENCOUNTER — Encounter (INDEPENDENT_AMBULATORY_CARE_PROVIDER_SITE_OTHER): Payer: Self-pay | Admitting: Internal Medicine

## 2015-07-13 DIAGNOSIS — M329 Systemic lupus erythematosus, unspecified: Secondary | ICD-10-CM

## 2015-07-13 DIAGNOSIS — R7303 Prediabetes: Secondary | ICD-10-CM

## 2015-07-13 DIAGNOSIS — R740 Nonspecific elevation of levels of transaminase and lactic acid dehydrogenase [LDH]: Secondary | ICD-10-CM

## 2015-07-13 DIAGNOSIS — E039 Hypothyroidism, unspecified: Secondary | ICD-10-CM

## 2015-07-13 DIAGNOSIS — R7402 Elevation of levels of lactic acid dehydrogenase (LDH): Secondary | ICD-10-CM

## 2015-07-13 DIAGNOSIS — R7401 Elevation of levels of liver transaminase levels: Secondary | ICD-10-CM

## 2015-07-13 NOTE — Progress Notes (Signed)
Patient presented to the office for blood work.  Labs completed by the [facility] phlebotomist.  Patient tolerated procedure well and left in good condition.

## 2015-07-18 ENCOUNTER — Ambulatory Visit (INDEPENDENT_AMBULATORY_CARE_PROVIDER_SITE_OTHER): Payer: PRIVATE HEALTH INSURANCE | Admitting: Internal Medicine

## 2015-07-18 ENCOUNTER — Encounter (INDEPENDENT_AMBULATORY_CARE_PROVIDER_SITE_OTHER): Payer: Self-pay | Admitting: Internal Medicine

## 2015-07-18 VITALS — BP 137/83 | HR 79 | Temp 98.3°F | Resp 16 | Wt 201.0 lb

## 2015-07-18 DIAGNOSIS — R7303 Prediabetes: Secondary | ICD-10-CM

## 2015-07-18 DIAGNOSIS — E039 Hypothyroidism, unspecified: Secondary | ICD-10-CM

## 2015-07-18 DIAGNOSIS — M329 Systemic lupus erythematosus, unspecified: Secondary | ICD-10-CM

## 2015-07-18 NOTE — Progress Notes (Signed)
Subjective:      Date: 07/18/2015 5:51 PM   Patient ID: Lori Arnold is a 65 y.o. female.    Chief Complaint:  Chief Complaint   Patient presents with   . Hypothyroidism       HPI  Visit type: Hypothyroidism/Prediabetes  Control: at goal  Comorbid illness: none  Last follow-up: 6 months ago  Medication effectiveness/adherence: working well  Medication side effects: none  Interval events: no interval events  Interval symptoms: joint pain  Patient denies: no other symptoms including chest pain, shortness of breath, hypoglycemic episodes, polydipsia, polyuria, headaches, dizziness, or abdominal pain  Home glucose readings: AM Sugar Control: patient does not perform glucometer testing  Exercise: none  Diet: compliant with and well-balanced diet  Response to therapy: has been fair  A1c trend:   Lab Results   Component Value Date    HGBA1C 6.0* 03/11/2015     Trend is stable  Additional concerns: In addition, pt also needs medication refill.    In addition, pt has h/o hypothyroidism - compliant with current regimen.  Denies side effects to therapy.  Denies any change in hair/nails/skin, constipation, unexplained wt changes.  Pt has h/o Lupus - stable on current MTX dosing - rheumatology surveillance is UTD      Problem List:  Patient Active Problem List   Diagnosis   . Fibroids   . Prediabetes   . Lupus   . Hypothyroidism       Current Medications:  Current Outpatient Prescriptions   Medication Sig Dispense Refill   . acetaminophen-codeine (TYLENOL #3) 300-30 MG per tablet      . buPROPion XL (WELLBUTRIN XL) 300 MG 24 hr tablet TAKE 1 TABLET BY MOUTH EVERY DAY IN THE MORNING 30 tablet 5   . cetirizine (ZYRTEC) 10 MG chewable tablet Chew 10 mg by mouth daily.     . Cholecalciferol (VITAMIN D3) 5000 UNITS Tab Take by mouth.     Marland Kitchen DHEA 25 MG Cap Take by mouth.     . Fesoterodine Fumarate (TOVIAZ PO) Take 4 mg by mouth every morning.        . fluticasone (FLONASE) 50 MCG/ACT nasal spray 1 spray by Nasal route as needed for  Rhinitis.     . folic acid (FOLVITE) 1 MG tablet      . glucosamine-chondroitin 500-400 MG tablet Take 1 tablet by mouth 3 (three) times daily.     Marland Kitchen HYDROcodone-acetaminophen (NORCO 10-325) 10-325 MG per tablet      . hydroxychloroquine (PLAQUENIL) 200 MG tablet Take by mouth 2 (two) times daily.        Marland Kitchen levothyroxine (SYNTHROID) 137 MCG tablet Take 1 tablet (137 mcg total) by mouth every morning before breakfast. 30 tablet 5   . liothyronine (CYTOMEL) 5 MCG tablet Take 5 mcg by mouth every morning.     . metFORMIN (GLUCOPHAGE XR) 500 MG 24 hr tablet Take 1 tablet (500 mg total) by mouth every morning with breakfast. 30 tablet 5   . Mometasone Furo-Formoterol Fum 100-5 MCG/ACT Aerosol Inhale into the lungs as needed.     . predniSONE (DELTASONE) 5 MG tablet      . risedronate (ACTONEL) 150 MG tablet Take 150 mg by mouth every 30 (thirty) days. with water on empty stomach, nothing by mouth or lie down for next 30 minutes.     . tolterodine (DETROL LA) 4 MG 24 hr capsule TAKE 1 CAPSULE BY MOUTH EVERY DAY 30 capsule 6   .  valacyclovir (VALTREX) 1000 MG tablet TAKE 1 TABLET BY MOUTH EVERY 12 HOURS FOR 7 DAYS 14 tablet 5   . venlafaxine (EFFEXOR-XR) 75 MG 24 hr capsule TAKE 1 CAPSULE BY MOUTH EVERY DAY WITH FOOD 30 capsule 5   . methotrexate 2.5 MG tablet Take 10 mg by mouth twice a week.       No current facility-administered medications for this visit.       Allergies:  No Known Allergies    Past Medical History:  Past Medical History   Diagnosis Date   . Arthritis    . Hypothyroidism      TAKES CYTOMEL & SYNTHROID   . Lupus      HAS ARTHRITIC TYPE SYMPTOMS   . Depression    . Anxiety    . Prediabetes        Past Surgical History:  Past Surgical History   Procedure Laterality Date   . Trigger finger release Bilateral 16109 & 2014   . Colonoscopy  06/2013   . Tonsillectomy  1959   . Dilation & curretage  1984     AFTER A STILLBORN   . De quervain's release Bilateral 1988   . Robot assisted, laparoscopic, single site,  hysterectomy, total, bso  N/A 12/30/2014     Procedure: ROBOT ASSISTED, LAPAROSCOPIC, SINGLE SITE, HYSTERECTOMY, TOTAL BSO WITH CYSTOSCOPY;  Surgeon: Orson Aloe, MD;  Location: [facility];  Service: Gynecology;  Laterality: N/A;  ROBOT ASSISTED, LAPAROSCOPIC, SINGLE SITE, HYSTERECTOMY, TOTAL BSO WITH CYSTOSCOPY       Family History:  Family History   Problem Relation Age of Onset   . Heart disease Mother    . Cancer Father    . Heart disease Sister    . Heart disease Brother        Social History:  Social History     Social History   . Marital Status: Divorced     Spouse Name: N/A   . Number of Children: N/A   . Years of Education: N/A     Occupational History   . Not on file.     Social History Main Topics   . Smoking status: Former Smoker -- 1.00 packs/day for 25 years     Quit date: 12/09/1994   . Smokeless tobacco: Not on file   . Alcohol Use: 1.2 oz/week     2 Shots of liquor per week   . Drug Use: No   . Sexual Activity: No     Other Topics Concern   . Not on file     Social History Narrative       The following portions of the patient's history were reviewed and updated as appropriate: allergies, current medications, past family history, past medical history, past social history, past surgical history and problem list.    Vitals:  BP 137/83 mmHg  Pulse 79  Temp(Src) 98.3 F (36.8 C) (Oral)  Resp 16  Wt 91.173 kg (201 lb)  SpO2 92%    ROS:  General/Constitutional:   Denies Chills. Denies Fatigue. Denies Fever.   Ophthalmologic:   Denies Blurred vision.   ENT:   Denies Nasal Discharge. Denies Sinus pain. Denies Sore throat.   Respiratory:   Denies Cough. Denies Shortness of breath. Denies Wheezing.   Cardiovascular:   Denies Chest pain. Denies Chest pain with exertion. Denies Palpitations. Denies  Swelling in hands/feet.   Gastrointestinal:   Denies Abdominal pain. Denies Constipation. Denies Diarrhea. Denies Nausea. Denies  Vomiting.  Skin:   Denies Rash.   Neurologic:   Denies Dizziness.  Denies Headache. Denies Tingling/Numbness.     The following portions of the patient's history were reviewed and updated as appropriate: Allergies, Current Medications, Past Family History, Past Medical history, Past social history, Past surgical history, and Problem List.        Objective:       Physical Exam:  General Examination:   GENERAL APPEARANCE: alert, in no acute distress, well developed, well nourished,  oriented to time, place, and person.   ORAL CAVITY: normal oropharynx, normal lips, mucosa moist.   THROAT: normal appearance, clear.   NECK/THYROID: neck supple, no carotid bruit, carotid pulse 2+ bilaterally, no cervical  lymphadenopathy, no neck mass palpated, no jugular venous distention, no  thyromegaly.   HEART: S1, S2 normal, no murmurs, rubs, gallops, regular rate and rhythm.   LUNGS: normal effort / no distress, normal breath sounds, clear to auscultation  bilaterally, no wheezes, rales, rhonchi.   EXTREMITIES: no clubbing, cyanosis, or edema bilaterally.   PERIPHERAL PULSES: 2+ dorsalis pedis, 2+ posterior tibial bilaterally.   NEUROLOGIC: alert and oriented.       Assessment:       1. Acquired hypothyroidism  - TSH  - T3, free  - T4, free    2. Prediabetes  - Hemoglobin A1C    3. Lupus  - CBC and differential  - Comprehensive metabolic panel  - C Reactive Protein  - Sedimentation rate (ESR)  - GGT        Plan:     Hypothyroidism:  Prior TFT's indicated supratherapeutic treatment with thyroid replacement therapy. Repeat TSH, FT4, FT3.  (labs drawn on 2/12017 have to be repeated)    Prediabetes:  Lab Results   Component Value Date    HGBA1C 6.0* 03/11/2015     Prior A1C elevated at prediabetic levels.  Continue to optimize low carbohydrate diet and aerobic exercise measures.  Obtain A1C today.     Lupus:  Stable on current regimen.  Rheumatology surveillance is UTD.  Fax labs to Dr. Kimberlee Nearing Raymond Gurney, MD

## 2015-07-19 ENCOUNTER — Encounter (INDEPENDENT_AMBULATORY_CARE_PROVIDER_SITE_OTHER): Payer: Self-pay | Admitting: Internal Medicine

## 2015-07-19 LAB — CBC AND DIFFERENTIAL
Baso(Absolute): 0 10*3/uL (ref 0.0–0.2)
Basos: 0 %
Eos: 1 %
Eosinophils Absolute: 0.1 10*3/uL (ref 0.0–0.4)
Hematocrit: 40.2 % (ref 34.0–46.6)
Hemoglobin: 13.2 g/dL (ref 11.1–15.9)
Immature Granulocytes Absolute: 0 10*3/uL (ref 0.0–0.1)
Immature Granulocytes: 0 %
Lymphocytes Absolute: 1.3 10*3/uL (ref 0.7–3.1)
Lymphocytes: 17 %
MCH: 32.9 pg (ref 26.6–33.0)
MCHC: 32.8 g/dL (ref 31.5–35.7)
MCV: 100 fL — ABNORMAL HIGH (ref 79–97)
Monocytes Absolute: 0.6 10*3/uL (ref 0.1–0.9)
Monocytes: 7 %
Neutrophils Absolute: 5.9 10*3/uL (ref 1.4–7.0)
Neutrophils: 75 %
Platelets: 322 10*3/uL (ref 150–379)
RBC: 4.01 x10E6/uL (ref 3.77–5.28)
RDW: 14.7 % (ref 12.3–15.4)
WBC: 7.9 10*3/uL (ref 3.4–10.8)

## 2015-07-19 LAB — COMPREHENSIVE METABOLIC PANEL
ALT: 33 IU/L — ABNORMAL HIGH (ref 0–32)
AST (SGOT): 23 IU/L (ref 0–40)
Albumin/Globulin Ratio: 1.8 (ref 1.1–2.5)
Albumin: 4.3 g/dL (ref 3.6–4.8)
Alkaline Phosphatase: 67 IU/L (ref 39–117)
BUN / Creatinine Ratio: 15 (ref 11–26)
BUN: 14 mg/dL (ref 8–27)
Bilirubin, Total: <0.2 mg/dL (ref 0.0–1.2)
CO2: 26 mmol/L (ref 18–29)
Calcium: 9.7 mg/dL (ref 8.7–10.3)
Chloride: 100 mmol/L (ref 96–106)
Creatinine: 0.96 mg/dL (ref 0.57–1.00)
EGFR: 64 mL/min/{1.73_m2} (ref >59)
EGFR: 73 mL/min/{1.73_m2} (ref >59)
Globulin, Total: 2.4 g/dL (ref 1.5–4.5)
Glucose: 112 mg/dL — ABNORMAL HIGH (ref 65–99)
Potassium: 4.7 mmol/L (ref 3.5–5.2)
Protein, Total: 6.7 g/dL (ref 6.0–8.5)
Sodium: 142 mmol/L (ref 134–144)

## 2015-07-19 LAB — SEDIMENTATION RATE: Sed Rate: 3 mm/hr (ref 0–40)

## 2015-07-19 LAB — GGT: Gamma Gluten Transferase: 35 IU/L (ref 0–60)

## 2015-07-19 LAB — T4, FREE: T4, Free: 1.56 ng/dL (ref 0.82–1.77)

## 2015-07-19 LAB — TSH: TSH: 0.022 u[IU]/mL — ABNORMAL LOW (ref 0.450–4.500)

## 2015-07-19 LAB — HEMOGLOBIN A1C: Hemoglobin A1C: 5.7 % — ABNORMAL HIGH (ref 4.8–5.6)

## 2015-07-19 LAB — C-REACTIVE PROTEIN: C-Reactive Protein: 4.1 mg/L (ref 0.0–4.9)

## 2015-07-19 LAB — T3, FREE: T3, Free: 3.5 pg/mL (ref 2.0–4.4)

## 2015-07-20 ENCOUNTER — Encounter (INDEPENDENT_AMBULATORY_CARE_PROVIDER_SITE_OTHER): Payer: Self-pay | Admitting: Internal Medicine

## 2015-07-20 ENCOUNTER — Telehealth (INDEPENDENT_AMBULATORY_CARE_PROVIDER_SITE_OTHER): Payer: Self-pay

## 2015-07-20 NOTE — Telephone Encounter (Signed)
Recent test results manually faxed to Dr Meyer Cory @ [phone].

## 2015-07-25 ENCOUNTER — Encounter (INDEPENDENT_AMBULATORY_CARE_PROVIDER_SITE_OTHER): Payer: Self-pay | Admitting: Internal Medicine

## 2015-07-25 DIAGNOSIS — E039 Hypothyroidism, unspecified: Secondary | ICD-10-CM

## 2015-07-26 MED ORDER — LEVOTHYROXINE SODIUM 125 MCG PO TABS
125.0000 ug | ORAL_TABLET | Freq: Every day | ORAL | Status: DC
Start: 2015-07-26 — End: 2015-09-05

## 2015-07-26 NOTE — Telephone Encounter (Signed)
Levothyroxine Script signed and sent to pharmacy on file

## 2015-08-05 ENCOUNTER — Other Ambulatory Visit (INDEPENDENT_AMBULATORY_CARE_PROVIDER_SITE_OTHER): Payer: Self-pay | Admitting: Internal Medicine

## 2015-08-26 ENCOUNTER — Encounter (INDEPENDENT_AMBULATORY_CARE_PROVIDER_SITE_OTHER): Payer: Self-pay | Admitting: Internal Medicine

## 2015-09-02 ENCOUNTER — Encounter (INDEPENDENT_AMBULATORY_CARE_PROVIDER_SITE_OTHER): Payer: Self-pay | Admitting: Internal Medicine

## 2015-09-02 ENCOUNTER — Telehealth (INDEPENDENT_AMBULATORY_CARE_PROVIDER_SITE_OTHER): Payer: Self-pay

## 2015-09-02 NOTE — Telephone Encounter (Signed)
Call to [facility] Pre-Op Lori Arnold. Regarding Pre-op Orders, Surgery Scheduled &  Surgeon to perform procedure?  Lori Arnold will research and call the office back.

## 2015-09-02 NOTE — Telephone Encounter (Signed)
Received Triage Voicemail/[facility] Pre-Op Department.  Requesting Medical Clearance for Surgery on Monday, 09/05/15 to be faxed to [phone].  Please review scanned document 08/26/15 [facility] (Total #14 pages)

## 2015-09-02 NOTE — Telephone Encounter (Signed)
Left VM on patient's CP to contact me with information

## 2015-09-02 NOTE — Telephone Encounter (Signed)
Lorne Skeens D, LPN at 08/16/6576 5:57 PM     Status: Signed        Expand All Collapse All     Patient returned call. Scheduled for Total Right Hip Replacement 09/05/15 @ 3PM, [facility] w/ Dr Rubye Oaks. Patient reports she is S/p Flu and Sinus Infection 08/26/15. "feeling much better" Advised patient may need to return to office for medical clearance. Patient has Pre-Op orders she will upload to MyChart Portal for Dr Connolly's review. Advised patient will forward info to Dr Lula Olszewski and to expect a reply within 24 hours.

## 2015-09-02 NOTE — Telephone Encounter (Signed)
Patient returned call.  Scheduled for Total Right Hip Replacement 09/05/15 @ 3PM, [facility] w/ Dr Rubye Oaks. Patient reports she is S/p Flu and Sinus Infection 08/26/15. "feeling much better"  Advised patient may need to return to office for medical clearance.  Patient has Pre-Op orders she will upload to MyChart Portal for Dr Connolly's review.  Advised patient will forward info to Dr Lula Olszewski and to expect a reply within 24 hours.

## 2015-09-03 ENCOUNTER — Ambulatory Visit (FREE_STANDING_LABORATORY_FACILITY): Payer: PRIVATE HEALTH INSURANCE

## 2015-09-03 ENCOUNTER — Other Ambulatory Visit (INDEPENDENT_AMBULATORY_CARE_PROVIDER_SITE_OTHER): Payer: Self-pay | Admitting: Internal Medicine

## 2015-09-03 DIAGNOSIS — E039 Hypothyroidism, unspecified: Secondary | ICD-10-CM

## 2015-09-03 DIAGNOSIS — E876 Hypokalemia: Secondary | ICD-10-CM

## 2015-09-03 NOTE — Telephone Encounter (Signed)
Discussed pre-op testing with pt.  Pt will obtain repeat CMP this afternoon.  Pt will follow-up Monday at 745am for pre-op H+P.

## 2015-09-03 NOTE — Progress Notes (Signed)
Preoperative testing demonstrated hypokalemia.  Discussed results with pt.  Pt will schedule nurse appt this afternoon for CMP.  Surgery scheduled for Monday - 3/27

## 2015-09-03 NOTE — Progress Notes (Signed)
Patient here for blood draw. Dr. Lula Olszewski order in Epic. Patient verbally consented to venipuncture. Venipuncture performed right A/C. Patient tolerated procedure well with no complications.

## 2015-09-04 LAB — COMPREHENSIVE METABOLIC PANEL
ALT: 73 U/L — ABNORMAL HIGH (ref 0–55)
AST (SGOT): 35 U/L — ABNORMAL HIGH (ref 5–34)
Albumin/Globulin Ratio: 1.3 (ref 0.9–2.2)
Albumin: 3.6 g/dL (ref 3.5–5.0)
Alkaline Phosphatase: 66 U/L (ref 37–106)
BUN: 14 mg/dL (ref 7.0–19.0)
Bilirubin, Total: 0.4 mg/dL (ref 0.1–1.2)
CO2: 26 mEq/L (ref 21–30)
Calcium: 9.1 mg/dL (ref 8.5–10.5)
Chloride: 107 mEq/L (ref 100–111)
Creatinine: 0.8 mg/dL (ref 0.4–1.5)
Globulin: 2.7 g/dL (ref 2.0–3.7)
Glucose: 100 mg/dL (ref 70–100)
Potassium: 4.2 mEq/L (ref 3.5–5.3)
Protein, Total: 6.3 g/dL (ref 6.0–8.3)
Sodium: 142 mEq/L (ref 135–146)

## 2015-09-04 LAB — GFR: EGFR: >60

## 2015-09-04 LAB — T3, FREE: T3, Free: 3.12 pg/mL (ref 1.71–3.71)

## 2015-09-04 LAB — HEMOLYSIS INDEX: Hemolysis Index: 4 (ref 0–18)

## 2015-09-04 LAB — TSH: TSH: 0.14 u[IU]/mL — ABNORMAL LOW (ref 0.35–4.94)

## 2015-09-04 LAB — T4, FREE: T4 Free: 1.16 ng/dL (ref 0.70–1.48)

## 2015-09-05 ENCOUNTER — Other Ambulatory Visit (INDEPENDENT_AMBULATORY_CARE_PROVIDER_SITE_OTHER): Payer: Self-pay | Admitting: Internal Medicine

## 2015-09-05 ENCOUNTER — Encounter (INDEPENDENT_AMBULATORY_CARE_PROVIDER_SITE_OTHER): Payer: Self-pay | Admitting: Internal Medicine

## 2015-09-05 MED ORDER — LEVOTHYROXINE SODIUM 100 MCG PO TABS
100.0000 ug | ORAL_TABLET | Freq: Every day | ORAL | Status: DC
Start: 2015-09-05 — End: 2016-07-25

## 2015-09-05 NOTE — Progress Notes (Signed)
Subjective:      Date: 09/05/2015 8:00 AM   Patient ID: Lori Arnold is a 65 y.o. female.    Chief Complaint:  Chief Complaint   Patient presents with   . Pre-op Exam       HPI:  Visit Type: Pre-operative Evaluation  Procedure: total hip replacement-right  Date of Surgery: 09/05/2015  Surgeon: Dr. Nelwyn Salisbury  Chief Complaint: Right hip pain  Recent Health (admits): viral URI 7-10 days ago - symptoms resolved  Recent Health (denies): fever, fatigue, chest pain, cough, nausea, vomiting, diarrhea, dyspnea, dysuria, urinary frequency, abdominal pain, easy bruising and LE swelling  Exercise Tolerance: 5 met ( dancing )  Surgical Risk Factors: No active cardiac or  pulmonary conditions  Prior Anesthesia: no complications    Problem List:  Patient Active Problem List   Diagnosis   . Fibroids   . Prediabetes   . Lupus   . Hypothyroidism       Current Medications:  Current Outpatient Prescriptions   Medication Sig Dispense Refill   . acetaminophen-codeine (TYLENOL #3) 300-30 MG per tablet      . buPROPion XL (WELLBUTRIN XL) 300 MG 24 hr tablet TAKE 1 TABLET BY MOUTH EVERY DAY IN THE MORNING 30 tablet 5   . cetirizine (ZYRTEC) 10 MG chewable tablet Chew 10 mg by mouth daily.     . Cholecalciferol (VITAMIN D3) 5000 UNITS Tab Take by mouth.     Marland Kitchen DHEA 25 MG Cap Take by mouth.     . Fesoterodine Fumarate (TOVIAZ PO) Take 4 mg by mouth every morning.        . fluticasone (FLONASE) 50 MCG/ACT nasal spray 1 spray by Nasal route as needed for Rhinitis.     . folic acid (FOLVITE) 1 MG tablet      . glucosamine-chondroitin 500-400 MG tablet Take 1 tablet by mouth 3 (three) times daily.     Marland Kitchen HYDROcodone-acetaminophen (NORCO 10-325) 10-325 MG per tablet      . hydroxychloroquine (PLAQUENIL) 200 MG tablet Take by mouth 2 (two) times daily.        Marland Kitchen levothyroxine (SYNTHROID) 125 MCG tablet Take 1 tablet (125 mcg total) by mouth daily. 30 tablet 5   . liothyronine (CYTOMEL) 5 MCG tablet Take 5 mcg by mouth every morning.     . metFORMIN  (GLUCOPHAGE XR) 500 MG 24 hr tablet Take 1 tablet (500 mg total) by mouth every morning with breakfast. 30 tablet 5   . methotrexate 2.5 MG tablet Take 10 mg by mouth twice a week.     . Mometasone Furo-Formoterol Fum 100-5 MCG/ACT Aerosol Inhale into the lungs as needed.     . predniSONE (DELTASONE) 5 MG tablet      . risedronate (ACTONEL) 150 MG tablet Take 150 mg by mouth every 30 (thirty) days. with water on empty stomach, nothing by mouth or lie down for next 30 minutes.     . tolterodine (DETROL LA) 4 MG 24 hr capsule TAKE 1 CAPSULE BY MOUTH EVERY DAY 30 capsule 6   . valacyclovir (VALTREX) 1000 MG tablet TAKE 1 TABLET BY MOUTH EVERY 12 HOURS FOR 7 DAYS 14 tablet 5   . venlafaxine (EFFEXOR-XR) 75 MG 24 hr capsule TAKE 1 CAPSULE BY MOUTH EVERY DAY WITH FOOD 30 capsule 5     No current facility-administered medications for this visit.       Allergies:  No Known Allergies    Past Medical History:  Past  Medical History   Diagnosis Date   . Arthritis    . Hypothyroidism      TAKES CYTOMEL & SYNTHROID   . Lupus      HAS ARTHRITIC TYPE SYMPTOMS   . Depression    . Anxiety    . Prediabetes        Past Surgical History:  Past Surgical History   Procedure Laterality Date   . Trigger finger release Bilateral 19147 & 2014   . Colonoscopy  06/2013   . Tonsillectomy  1959   . Dilation & curretage  1984     AFTER A STILLBORN   . De quervain's release Bilateral 1988   . Robot assisted, laparoscopic, single site, hysterectomy, total, bso  N/A 12/30/2014     Procedure: ROBOT ASSISTED, LAPAROSCOPIC, SINGLE SITE, HYSTERECTOMY, TOTAL BSO WITH CYSTOSCOPY;  Surgeon: Orson Aloe, MD;  Location: [facility];  Service: Gynecology;  Laterality: N/A;  ROBOT ASSISTED, LAPAROSCOPIC, SINGLE SITE, HYSTERECTOMY, TOTAL BSO WITH CYSTOSCOPY       Family History:  Family History   Problem Relation Age of Onset   . Heart disease Mother    . Cancer Father    . Heart disease Sister    . Heart disease Brother        Social History:  Social  History     Social History   . Marital Status: Divorced     Spouse Name: N/A   . Number of Children: N/A   . Years of Education: N/A     Occupational History   . Not on file.     Social History Main Topics   . Smoking status: Former Smoker -- 1.00 packs/day for 25 years     Quit date: 12/09/1994   . Smokeless tobacco: Not on file   . Alcohol Use: 1.2 oz/week     2 Shots of liquor per week   . Drug Use: No   . Sexual Activity: No     Other Topics Concern   . Not on file     Social History Narrative       The following portions of the patient's history were reviewed and updated as appropriate: allergies, current medications, past family history, past medical history, past social history, past surgical history and problem list.    Vitals:  138/84  P72  R12  T98.8  ROS:  General/Constitutional:           Denies Chills.  Denies Fatigue.  Denies Fever.       Ophthalmologic:           Denies Blurred vision.       ENT:           Denies Nasal Discharge.  Denies Sinus pain.  Denies Sore throat.       Endocrine:           Denies Polydipsia.  Denies Polyuria.       Respiratory:           Denies Cough.  Denies Orthopnea.  Denies Shortness of breath.  Denies Wheezing.       Cardiovascular:           Denies Chest pain.  Denies Chest pain with exertion.  Denies Palpitations.  Denies Swelling in hands/feet.       Gastrointestinal:           Denies Abdominal pain.  Denies Constipation.  Denies Diarrhea.  Denies Nausea.  Denies Vomiting.  Hematology:           Denies Easy bruising.  Denies Easy Bleeding.       Genitourinary:           Denies Blood in urine.  Denies Painful urination.       Peripheral Vascular:           Denies Pain/cramping in legs after exertion.  Denies Painful extremities.       Skin:           Denies Rash.       Neurologic:           Denies Dizziness.  Denies Pre-Syncope.  Denies Tingling/Numbness.       Psychiatric:           Denies Anxiety.  Denies Depressed mood.       Objective:     Examination:   General  Examination:  GENERAL APPEARANCE: alert, in no acute distress, well developed, well nourished, oriented to time, place, and person.   HEAD: normal appearance.   EYES: extraocular movement intact (EOMI), pupils equal, round, reactive to light and accommodation, sclera anicteric.   EARS: tympanic membranes normal bilaterally, external canals normal .   NOSE: normal nasal mucosa, nares patent.   ORAL CAVITY: normal oropharynx.   THROAT: no erythema, no exudate.   NECK/THYROID: neck supple, carotid pulse 2+ bilaterally, no lymphadenopathy, no thyromegaly.   SKIN: no rashes.   HEART: S1, S2 normal, no murmurs, rubs, gallops, regular rate and rhythm.   LUNGS: normal effort / no distress, normal breath sounds, clear to auscultation bilaterally, no wheezes, rales, rhonchi.   ABDOMEN: bowel sounds present, no hepatosplenomegaly, soft, nontender, nondistended.   EXTREMITIES: no clubbing, cyanosis, or edema.   PERIPHERAL PULSES: 2+ dorsalis pedis, 2+ posterior tibial.   NEUROLOGIC: nonfocal, cranial nerves 2-12 grossly intact, deep tendon reflexes 2+ symmetrical, normal strength, tone and reflexes, sensory exam intact.   PSYCH: alert, oriented, cognitive function intact.      Assessment/Plan:     Pre-operative Evaluation:  There are no active cardiopulmonary symptoms. Exercise tolerance is greater than 4 mets.  Prior hypokalemia on RHC pre-op testing has resolved (see attached results).  ECG - NSR, no active ST-T changes.  Mild low WBC - most likely due to SLE/meds/viral illness.  Pt is at low risk for peri-operative cardiovascular complications during planned intermediate risk surgical procedure.    SLE:  Stable on current medications.  LFT's elevated on current medicine regimen.    Hypothyroidism:  Lab Results   Component Value Date    TSH 0.14* 09/03/2015    FREET3 3.12 09/03/2015    T4FREE 1.16 09/03/2015     Thyroid function tests indicate supra-therapeutic treatment with thyroid replacement therapy.  Recommend decreasing  Levothyroxine to  100 mcg one PO daily.  Repeat TSH, FT3, FT4, TT3 in 6-12 weeks.     Prediabetes:  Controlled on current medication.  Hold Metformin prior to surgical procedure.        Sandford Craze, MD

## 2015-09-20 ENCOUNTER — Ambulatory Visit (INDEPENDENT_AMBULATORY_CARE_PROVIDER_SITE_OTHER): Payer: Self-pay

## 2015-09-29 ENCOUNTER — Encounter (INDEPENDENT_AMBULATORY_CARE_PROVIDER_SITE_OTHER): Payer: Self-pay | Admitting: Internal Medicine

## 2015-10-04 ENCOUNTER — Encounter (INDEPENDENT_AMBULATORY_CARE_PROVIDER_SITE_OTHER): Payer: Self-pay | Admitting: Internal Medicine

## 2015-10-05 MED ORDER — ZOLPIDEM TARTRATE 5 MG PO TABS
5.0000 mg | ORAL_TABLET | Freq: Every evening | ORAL | Status: DC | PRN
Start: 2015-10-05 — End: 2016-01-03

## 2015-10-05 NOTE — Telephone Encounter (Signed)
See MyChart message - Script signed, printed, and placed in [facility] triage bin

## 2015-10-05 NOTE — Telephone Encounter (Signed)
Script(s) received and manually faxed to pharmacy on [employer].

## 2015-12-10 ENCOUNTER — Other Ambulatory Visit (INDEPENDENT_AMBULATORY_CARE_PROVIDER_SITE_OTHER): Payer: Self-pay | Admitting: Internal Medicine

## 2016-01-02 ENCOUNTER — Other Ambulatory Visit (INDEPENDENT_AMBULATORY_CARE_PROVIDER_SITE_OTHER): Payer: Self-pay | Admitting: Internal Medicine

## 2016-01-03 ENCOUNTER — Other Ambulatory Visit (INDEPENDENT_AMBULATORY_CARE_PROVIDER_SITE_OTHER): Payer: Self-pay | Admitting: Internal Medicine

## 2016-01-03 MED ORDER — ZOLPIDEM TARTRATE 5 MG PO TABS
5.0000 mg | ORAL_TABLET | Freq: Every evening | ORAL | Status: DC | PRN
Start: 2016-01-03 — End: 2016-05-11

## 2016-01-03 NOTE — Telephone Encounter (Signed)
Scripts signed and sent to pharmacy on file

## 2016-01-03 NOTE — Telephone Encounter (Signed)
Pharmacy requesting refill on medication      Last OV 07/2015  Upcoming: N/A

## 2016-01-16 ENCOUNTER — Other Ambulatory Visit (INDEPENDENT_AMBULATORY_CARE_PROVIDER_SITE_OTHER): Payer: Self-pay | Admitting: Internal Medicine

## 2016-01-19 ENCOUNTER — Encounter (INDEPENDENT_AMBULATORY_CARE_PROVIDER_SITE_OTHER): Payer: Self-pay | Admitting: Internal Medicine

## 2016-02-10 ENCOUNTER — Other Ambulatory Visit: Payer: Self-pay

## 2016-02-10 DIAGNOSIS — Z96641 Presence of right artificial hip joint: Secondary | ICD-10-CM

## 2016-02-10 DIAGNOSIS — S73004S Unspecified dislocation of right hip, sequela: Secondary | ICD-10-CM

## 2016-02-14 ENCOUNTER — Other Ambulatory Visit (INDEPENDENT_AMBULATORY_CARE_PROVIDER_SITE_OTHER): Payer: Self-pay | Admitting: Internal Medicine

## 2016-02-15 ENCOUNTER — Ambulatory Visit
Admission: RE | Admit: 2016-02-15 | Discharge: 2016-02-15 | Disposition: A | Discharge status: Home / Self Care | Payer: PRIVATE HEALTH INSURANCE | Source: Ambulatory Visit

## 2016-02-15 DIAGNOSIS — S73004S Unspecified dislocation of right hip, sequela: Secondary | ICD-10-CM

## 2016-02-15 DIAGNOSIS — Z96641 Presence of right artificial hip joint: Secondary | ICD-10-CM | POA: Insufficient documentation

## 2016-02-15 LAB — BODY FLUID CELL COUNT
Body Fluid Lymphocytes: 33 %
Body Fluid Monocyte/Macrophage Cells: 8 %
Body Fluid Polymorphonuclear Cell: 59 %
Body Fluid RBC: 62000 /mm3
Body Fluid WBC: 26 /mm3

## 2016-02-15 MED ORDER — LIDOCAINE HCL (PF) 1 % IJ SOLN
5.0000 mL | Freq: Once | INTRAMUSCULAR | Status: AC
Start: 2016-02-15 — End: 2016-02-15
  Administered 2016-02-15: 5 mL via INTRA_ARTICULAR

## 2016-03-13 ENCOUNTER — Encounter (INDEPENDENT_AMBULATORY_CARE_PROVIDER_SITE_OTHER): Payer: Self-pay | Admitting: Internal Medicine

## 2016-03-20 ENCOUNTER — Ambulatory Visit
Admission: RE | Admit: 2016-03-20 | Discharge: 2016-03-20 | Disposition: A | Discharge status: Home / Self Care | Payer: PRIVATE HEALTH INSURANCE | Source: Ambulatory Visit

## 2016-03-20 DIAGNOSIS — Z01818 Encounter for other preprocedural examination: Secondary | ICD-10-CM | POA: Insufficient documentation

## 2016-03-20 DIAGNOSIS — Z96641 Presence of right artificial hip joint: Secondary | ICD-10-CM | POA: Insufficient documentation

## 2016-03-20 LAB — PT AND APTT
PT INR: 0.8
PT: 11.4 s — ABNORMAL LOW (ref 12.6–15.0)
PTT: 27 s (ref 23–37)

## 2016-03-20 LAB — CBC AND DIFFERENTIAL
Absolute NRBC: 0 10*3/uL
Basophils Absolute Automated: 0.06 10*3/uL (ref 0.00–0.20)
Basophils Automated: 1 %
Eosinophils Absolute Automated: 0.22 10*3/uL (ref 0.00–0.70)
Eosinophils Automated: 3.5 %
Hematocrit: 40.2 % (ref 37.0–47.0)
Hgb: 12.7 g/dL (ref 12.0–16.0)
Immature Granulocytes Absolute: 0.03 10*3/uL
Immature Granulocytes: 0.5 %
Lymphocytes Absolute Automated: 1.54 10*3/uL (ref 0.50–4.40)
Lymphocytes Automated: 24.5 %
MCH: 29.7 pg (ref 28.0–32.0)
MCHC: 31.6 g/dL — ABNORMAL LOW (ref 32.0–36.0)
MCV: 94.1 fL (ref 80.0–100.0)
MPV: 9.1 fL — ABNORMAL LOW (ref 9.4–12.3)
Monocytes Absolute Automated: 0.61 10*3/uL (ref 0.00–1.20)
Monocytes: 9.7 %
Neutrophils Absolute: 3.83 10*3/uL (ref 1.80–8.10)
Neutrophils: 60.8 %
Nucleated RBC: 0 /100 WBC (ref 0.0–1.0)
Platelets: 337 10*3/uL (ref 140–400)
RBC: 4.27 10*6/uL (ref 4.20–5.40)
RDW: 15 % (ref 12–15)
WBC: 6.29 10*3/uL (ref 3.50–10.80)

## 2016-03-20 LAB — URINALYSIS, REFLEX TO MICROSCOPIC EXAM IF INDICATED
Bilirubin, UA: NEGATIVE
Blood, UA: NEGATIVE
Glucose, UA: NEGATIVE
Ketones UA: NEGATIVE
Nitrite, UA: NEGATIVE
Protein, UR: 30 — AB
Specific Gravity UA: 1.023 (ref 1.001–1.035)
Urine pH: 5 (ref 5.0–8.0)
Urobilinogen, UA: NORMAL mg/dL

## 2016-03-20 LAB — BASIC METABOLIC PANEL
Anion Gap: 11 (ref 5.0–15.0)
BUN: 16.9 mg/dL (ref 7.0–19.0)
CO2: 25 mEq/L (ref 22–29)
Calcium: 10.1 mg/dL (ref 8.5–10.5)
Chloride: 105 mEq/L (ref 100–111)
Creatinine: 0.9 mg/dL (ref 0.6–1.0)
Glucose: 90 mg/dL (ref 70–100)
Potassium: 5 mEq/L (ref 3.5–5.1)
Sodium: 141 mEq/L (ref 136–145)

## 2016-03-20 LAB — GFR: EGFR: >60

## 2016-03-21 LAB — ECG 12-LEAD
Atrial Rate: 85 {beats}/min
P Axis: 79 degrees
P-R Interval: 168 ms
Q-T Interval: 376 ms
QRS Duration: 80 ms
QTC Calculation (Bezet): 447 ms
R Axis: 56 degrees
T Axis: 69 degrees
Ventricular Rate: 85 {beats}/min

## 2016-03-23 ENCOUNTER — Ambulatory Visit (INDEPENDENT_AMBULATORY_CARE_PROVIDER_SITE_OTHER): Payer: PRIVATE HEALTH INSURANCE | Admitting: Internal Medicine

## 2016-03-23 ENCOUNTER — Encounter (INDEPENDENT_AMBULATORY_CARE_PROVIDER_SITE_OTHER): Payer: Self-pay | Admitting: Internal Medicine

## 2016-03-23 VITALS — BP 138/88 | HR 83 | Temp 98.4°F | Resp 16 | Ht 64.0 in | Wt 190.0 lb

## 2016-03-23 DIAGNOSIS — Z23 Encounter for immunization: Secondary | ICD-10-CM

## 2016-03-23 DIAGNOSIS — Z01810 Encounter for preprocedural cardiovascular examination: Secondary | ICD-10-CM

## 2016-03-23 DIAGNOSIS — R8290 Unspecified abnormal findings in urine: Secondary | ICD-10-CM

## 2016-03-23 DIAGNOSIS — E039 Hypothyroidism, unspecified: Secondary | ICD-10-CM

## 2016-03-23 DIAGNOSIS — R7303 Prediabetes: Secondary | ICD-10-CM

## 2016-03-23 MED ORDER — MIRABEGRON ER 50 MG PO TB24
50.0000 mg | ORAL_TABLET | Freq: Every day | ORAL | 5 refills | Status: DC
Start: 2016-03-23 — End: 2017-03-20

## 2016-03-23 NOTE — Progress Notes (Signed)
Subjective:      Date: 03/23/2016 12:57 PM   Patient ID: Lori Arnold is a 65 y.o. female.    Chief Complaint:  Chief Complaint   Patient presents with   . Pre-op Exam       HPI:  Visit Type: Pre-operative Evaluation  Procedure: Right Hip Arthroplasty  Date of Surgery: 04/11/2016  Surgeon: Dr Marvetta Gibbons  Fax Number (Required): [phone]  Chief Complaint: pre op  Recent Health (admits): no current complaints  Recent Health (denies): fever, fatigue, chest pain, cough, nausea, vomiting, diarrhea, dyspnea, dysuria, urinary frequency, abdominal pain, easy bruising and LE swelling  Exercise Tolerance: 4 met (climbing stairs )  Surgical Risk Factors: No active cardiac or  pulmonary conditions;   Prior Anesthesia: yes   In addition, pt has h/o hypothyroidism - compliant with current regimen.  Denies side effects to therapy.  Denies any change in hair/nails/skin, constipation, unexplained wt changes.  Pt has prediabetes - last A1C 5.7% in 07/2015.  Pt has depression - controlled on current medications  Problem List:  Patient Active Problem List   Diagnosis   . Fibroids   . Prediabetes   . Lupus   . Hypothyroidism       Current Medications:  Current Outpatient Prescriptions   Medication Sig Dispense Refill   . acetaminophen-codeine (TYLENOL #3) 300-30 MG per tablet      . buPROPion XL (WELLBUTRIN XL) 300 MG 24 hr tablet TAKE 1 TABLET BY MOUTH EVERY DAY IN THE MORNING 30 tablet 5   . cetirizine (ZYRTEC) 10 MG chewable tablet Chew 10 mg by mouth daily.     . Cholecalciferol (VITAMIN D3) 5000 UNITS Tab Take by mouth.     . fluticasone (FLONASE) 50 MCG/ACT nasal spray 1 spray by Nasal route as needed for Rhinitis.     . folic acid (FOLVITE) 1 MG tablet      . glucosamine-chondroitin 500-400 MG tablet Take 1 tablet by mouth 3 (three) times daily.     . hydroxychloroquine (PLAQUENIL) 200 MG tablet Take by mouth 2 (two) times daily.        Marland Kitchen levothyroxine (SYNTHROID, LEVOTHROID) 100 MCG tablet Take 1 tablet (100 mcg total) by  mouth Once a day at 6:00am. 90 tablet 3   . liothyronine (CYTOMEL) 5 MCG tablet TAKE 1 TABLET (5 MCG TOTAL) BY MOUTH DAILY. 30 tablet 6   . metFORMIN (GLUCOPHAGE XR) 500 MG 24 hr tablet Take 1 tablet (500 mg total) by mouth every morning with breakfast. 30 tablet 5   . Mometasone Furo-Formoterol Fum 100-5 MCG/ACT Aerosol Inhale into the lungs as needed.     . risedronate (ACTONEL) 150 MG tablet Take 150 mg by mouth every 30 (thirty) days. with water on empty stomach, nothing by mouth or lie down for next 30 minutes.     . valacyclovir (VALTREX) 1000 MG tablet TAKE 1 TABLET BY MOUTH EVERY 12 HOURS FOR 7 DAYS 14 tablet 5   . venlafaxine (EFFEXOR-XR) 75 MG 24 hr capsule TAKE 1 CAPSULE BY MOUTH EVERY DAY WITH FOOD 30 capsule 5   . zolpidem (AMBIEN) 5 MG tablet Take 1 tablet (5 mg total) by mouth nightly as needed for Sleep. 30 tablet 2   . Mirabegron ER (MYRBETRIQ) 50 MG Tablet SR 24 hr Take 50 mg by mouth daily. 30 tablet 5     No current facility-administered medications for this visit.        Allergies:  No Known Allergies  Past Medical History:  Past Medical History:   Diagnosis Date   . Anxiety    . Arthritis    . Depression    . Hypothyroidism     TAKES CYTOMEL & SYNTHROID   . Lupus     HAS ARTHRITIC TYPE SYMPTOMS   . Prediabetes        Past Surgical History:  Past Surgical History:   Procedure Laterality Date   . COLONOSCOPY  06/2013   . DE QUERVAIN'S RELEASE Bilateral 1988   . DILATION & CURRETAGE  1984    AFTER A STILLBORN   . ROBOT ASSISTED, SINGLE SITE, LAPAROSCOPIC, HYSTERECTOMY, TOTAL, BSO N/A 12/30/2014    Procedure: ROBOT ASSISTED, LAPAROSCOPIC, SINGLE SITE, HYSTERECTOMY, TOTAL BSO WITH CYSTOSCOPY;  Surgeon: Orson Aloe, MD;  Location: [facility];  Service: Gynecology;  Laterality: N/A;  ROBOT ASSISTED, LAPAROSCOPIC, SINGLE SITE, HYSTERECTOMY, TOTAL BSO WITH CYSTOSCOPY   . TONSILLECTOMY  1959   . TRIGGER FINGER RELEASE Bilateral 16109 & 2014       Family History:  Family History   Problem  Relation Age of Onset   . Heart disease Mother    . Cancer Father    . Heart disease Sister    . Heart disease Brother        Social History:  Social History     Social History   . Marital status: Divorced     Spouse name: N/A   . Number of children: N/A   . Years of education: N/A     Occupational History   . Not on file.     Social History Main Topics   . Smoking status: Former Smoker     Packs/day: 1.00     Years: 25.00     Quit date: 12/09/1994   . Smokeless tobacco: Not on file   . Alcohol use 1.2 oz/week     2 Shots of liquor per week   . Drug use: No   . Sexual activity: No     Other Topics Concern   . Not on file     Social History Narrative   . No narrative on file       The following sections were reviewed this encounter by the provider:   Tobacco  Allergies  Meds  Problems  Med Hx  Surg Hx  Fam Hx  Soc Hx        Vitals:  BP 138/88   Pulse 83   Temp 98.4 F (36.9 C) (Oral)   Resp 16   Ht 1.626 m (5\' 4" )   Wt 86.2 kg (190 lb)   SpO2 96%   BMI 32.61 kg/m     ROS:  General/Constitutional:           Denies Chills.  Denies Fatigue.  Denies Fever.       Ophthalmologic:           Denies Blurred vision.       ENT:           Denies Nasal Discharge.  Denies Sinus pain.  Denies Sore throat.       Endocrine:           Denies Polydipsia.  Denies Polyuria.       Respiratory:           Denies Cough.  Denies Orthopnea.  Denies Shortness of breath.  Denies Wheezing.       Cardiovascular:  Denies Chest pain.  Denies Chest pain with exertion.  Denies Palpitations.  Denies Swelling in hands/feet.       Gastrointestinal:           Denies Abdominal pain.  Denies Constipation.  Denies Diarrhea.  Denies Nausea.  Denies Vomiting.       Hematology:           Denies Easy bruising.  Denies Easy Bleeding.       Genitourinary:           Denies Blood in urine.  Denies Painful urination.       Peripheral Vascular:           Denies Pain/cramping in legs after exertion.  Denies Painful extremities.       Skin:            Denies Rash.       Neurologic:           Denies Dizziness.  Denies Pre-Syncope.  Denies Tingling/Numbness.       Psychiatric:           Denies Anxiety.  Denies Depressed mood.      Objective:     Examination:   General Examination:  GENERAL APPEARANCE: alert, in no acute distress, well developed, well nourished, oriented to time, place, and person.   HEAD: normal appearance.   EYES: extraocular movement intact (EOMI), pupils equal, round, reactive to light and accommodation, sclera anicteric.   EARS: tympanic membranes normal bilaterally, external canals normal .   NOSE: normal nasal mucosa, nares patent.   ORAL CAVITY: normal oropharynx.   THROAT: no erythema, no exudate.   NECK/THYROID: neck supple, carotid pulse 2+ bilaterally, no lymphadenopathy, no thyromegaly.   SKIN: no rashes.   HEART: S1, S2 normal, no murmurs, rubs, gallops, regular rate and rhythm.   LUNGS: normal effort / no distress, normal breath sounds, clear to auscultation bilaterally, no wheezes, rales, rhonchi.   ABDOMEN: bowel sounds present, no hepatosplenomegaly, soft, nontender, nondistended.   EXTREMITIES: no clubbing, cyanosis, or edema.   PERIPHERAL PULSES: 2+ dorsalis pedis, 2+ posterior tibial.   NEUROLOGIC: nonfocal, cranial nerves 2-12 grossly intact, deep tendon reflexes 2+ symmetrical, normal strength, tone and reflexes, sensory exam intact.   PSYCH: alert, oriented, cognitive function intact.      Assessment/Plan:       1. Preop cardiovascular exam    There are no active cardiopulmonary symptoms. Exercise tolerance is greater than 4 mets.  Recommend hold Metformin 48hrs prior to the surgical procedure. Pt is at low risk for peri-operative cardiovascular complications during planned intermediate risk surgical procedure.    2. Acquired hypothyroidism  - TSH  - T4, free  - T3, free  Lab Results   Component Value Date    TSH 0.14 (L) 09/03/2015    FREET3 3.12 09/03/2015    T4FREE 1.16 09/03/2015     Pt is  chemically mildly  supratherapeuticeuthyroid.  Continue current thyroid replacement medication regimen. Obtain TFT's today and reviewed 2017 TFT results with pt    3. Flu vaccine need  - Flu vaccine QUAD PRES FREE 32YRS & GREATER    4. Abnormal urinalysis  - Urinalysis  - Urine culture    5. Prediabetes  - Hemoglobin A1C  Lab Results   Component Value Date    HGBA1C 5.7 (H) 07/18/2015    HGBA1C 6.0 (H) 03/11/2015     Prior A1C elevated at prediabetic levels.  Continue to optimize low carbohydrate diet and aerobic  exercise measures.  Obtain A1C today. Reviewed 2016-2017 prediabetic lab indices with patient.      Sandford Craze, MD

## 2016-03-24 LAB — URINE MICROSCOPIC

## 2016-03-24 LAB — URINALYSIS
Bilirubin, UA: NEGATIVE
Glucose, UA: NEGATIVE
Ketones UA: NEGATIVE
Nitrite, UA: POSITIVE — AB
Specific Gravity UA: 1.024 (ref 1.001–1.035)
Urine pH: 5.5 (ref 5.0–8.0)
Urobilinogen, UA: 0.2

## 2016-03-24 LAB — TSH: TSH: 4.51 u[IU]/mL (ref 0.35–4.94)

## 2016-03-24 LAB — T4, FREE: T4 Free: 1.22 ng/dL (ref 0.70–1.48)

## 2016-03-24 LAB — T3, FREE: T3, Free: 2.78 pg/mL (ref 1.71–3.71)

## 2016-03-24 LAB — HEMOGLOBIN A1C
Average Estimated Glucose: 108.3 mg/dL
Hemoglobin A1C: 5.4 % (ref 4.6–5.9)

## 2016-03-26 ENCOUNTER — Other Ambulatory Visit (INDEPENDENT_AMBULATORY_CARE_PROVIDER_SITE_OTHER): Payer: Self-pay | Admitting: Internal Medicine

## 2016-03-26 ENCOUNTER — Other Ambulatory Visit: Payer: Self-pay

## 2016-03-26 MED ORDER — SULFAMETHOXAZOLE-TRIMETHOPRIM 800-160 MG PO TABS
1.0000 | ORAL_TABLET | Freq: Two times a day (BID) | ORAL | 0 refills | Status: AC
Start: 2016-03-26 — End: 2016-03-29

## 2016-03-26 NOTE — Telephone Encounter (Signed)
Dr Lula Olszewski,      Spoke with patient about UTI. Per your recommendations:        Urine culture confirms UTI (Klebsiella) - recommend Bactrim DS one PO BID x 3 days #6 No refill.        I have pre-filled medication with pharmacy        Thank you

## 2016-03-27 ENCOUNTER — Telehealth (INDEPENDENT_AMBULATORY_CARE_PROVIDER_SITE_OTHER): Payer: Self-pay | Admitting: Internal Medicine

## 2016-03-27 ENCOUNTER — Encounter (INDEPENDENT_AMBULATORY_CARE_PROVIDER_SITE_OTHER): Payer: Self-pay | Admitting: Internal Medicine

## 2016-03-27 NOTE — Telephone Encounter (Signed)
Copy of EKG,H&P and Labs work faxed to [facility], Audelia Hives. MD [phone].

## 2016-03-29 ENCOUNTER — Encounter (INDEPENDENT_AMBULATORY_CARE_PROVIDER_SITE_OTHER): Payer: Self-pay | Admitting: Internal Medicine

## 2016-03-29 DIAGNOSIS — N3001 Acute cystitis with hematuria: Secondary | ICD-10-CM

## 2016-04-03 ENCOUNTER — Other Ambulatory Visit: Payer: PRIVATE HEALTH INSURANCE

## 2016-04-04 ENCOUNTER — Other Ambulatory Visit (FREE_STANDING_LABORATORY_FACILITY): Payer: PRIVATE HEALTH INSURANCE

## 2016-04-04 DIAGNOSIS — N3001 Acute cystitis with hematuria: Secondary | ICD-10-CM

## 2016-04-05 LAB — URINALYSIS
Bilirubin, UA: NEGATIVE
Blood, UA: NEGATIVE
Glucose, UA: NEGATIVE
Ketones UA: NEGATIVE
Leukocyte Esterase, UA: NEGATIVE
Nitrite, UA: NEGATIVE
Protein, UR: NEGATIVE
Specific Gravity UA: 1.023 (ref 1.001–1.035)
Urine pH: 6 (ref 5.0–8.0)
Urobilinogen, UA: 0.2

## 2016-05-03 ENCOUNTER — Other Ambulatory Visit (INDEPENDENT_AMBULATORY_CARE_PROVIDER_SITE_OTHER): Payer: Self-pay | Admitting: Internal Medicine

## 2016-05-11 ENCOUNTER — Other Ambulatory Visit (INDEPENDENT_AMBULATORY_CARE_PROVIDER_SITE_OTHER): Payer: Self-pay | Admitting: Internal Medicine

## 2016-05-24 ENCOUNTER — Encounter (INDEPENDENT_AMBULATORY_CARE_PROVIDER_SITE_OTHER): Payer: Self-pay | Admitting: Internal Medicine

## 2016-05-30 ENCOUNTER — Encounter (INDEPENDENT_AMBULATORY_CARE_PROVIDER_SITE_OTHER): Payer: Self-pay | Admitting: Internal Medicine

## 2016-06-15 ENCOUNTER — Encounter (INDEPENDENT_AMBULATORY_CARE_PROVIDER_SITE_OTHER): Payer: Self-pay | Admitting: Internal Medicine

## 2016-07-15 ENCOUNTER — Other Ambulatory Visit (INDEPENDENT_AMBULATORY_CARE_PROVIDER_SITE_OTHER): Payer: Self-pay | Admitting: Internal Medicine

## 2016-07-16 MED ORDER — VALACYCLOVIR HCL 1 G PO TABS
1000.0000 mg | ORAL_TABLET | Freq: Two times a day (BID) | ORAL | 5 refills | Status: DC
Start: 2016-07-16 — End: 2017-08-08

## 2016-07-16 NOTE — Telephone Encounter (Signed)
Patient requesting refill on medication        LOV: 03/23/2016  Upcoming: n/a

## 2016-07-21 ENCOUNTER — Other Ambulatory Visit (INDEPENDENT_AMBULATORY_CARE_PROVIDER_SITE_OTHER): Payer: Self-pay | Admitting: Internal Medicine

## 2016-07-24 ENCOUNTER — Encounter (INDEPENDENT_AMBULATORY_CARE_PROVIDER_SITE_OTHER): Payer: Self-pay | Admitting: Family

## 2016-07-24 ENCOUNTER — Ambulatory Visit (INDEPENDENT_AMBULATORY_CARE_PROVIDER_SITE_OTHER): Payer: PRIVATE HEALTH INSURANCE | Admitting: Family

## 2016-07-24 VITALS — BP 155/85 | HR 83 | Temp 99.3°F | Resp 19 | Ht 64.0 in | Wt 205.0 lb

## 2016-07-24 DIAGNOSIS — J069 Acute upper respiratory infection, unspecified: Secondary | ICD-10-CM

## 2016-07-24 DIAGNOSIS — J029 Acute pharyngitis, unspecified: Secondary | ICD-10-CM

## 2016-07-24 DIAGNOSIS — J0100 Acute maxillary sinusitis, unspecified: Secondary | ICD-10-CM

## 2016-07-24 LAB — POCT RAPID STREP A: Rapid Strep A Screen POCT: NEGATIVE

## 2016-07-24 LAB — POCT INFLUENZA A/B
POCT Rapid Influenza A AG: NEGATIVE
POCT Rapid Influenza B AG: NEGATIVE

## 2016-07-24 MED ORDER — CLARITHROMYCIN 500 MG PO TABS
250.0000 mg | ORAL_TABLET | Freq: Two times a day (BID) | ORAL | 0 refills | Status: AC
Start: 2016-07-24 — End: 2016-08-03

## 2016-07-24 NOTE — Progress Notes (Signed)
[facility]    PROGRESS NOTE      Patient: Lori Arnold   Date: 07/24/2016   MRN: [id number]     Past Medical History:   Diagnosis Date   . Anxiety    . Arthritis    . Depression    . Hypothyroidism     TAKES CYTOMEL & SYNTHROID   . Lupus     HAS ARTHRITIC TYPE SYMPTOMS   . Prediabetes      Social History     Social History   . Marital status: Divorced     Spouse name: N/A   . Number of children: N/A   . Years of education: N/A     Occupational History   . Not on file.     Social History Main Topics   . Smoking status: Former Smoker     Packs/day: 1.00     Years: 25.00     Quit date: 12/09/1994   . Smokeless tobacco: Never Used   . Alcohol use 1.2 oz/week     2 Shots of liquor per week   . Drug use: No   . Sexual activity: No     Other Topics Concern   . Not on file     Social History Narrative   . No narrative on file     Family History   Problem Relation Age of Onset   . Heart disease Mother    . Cancer Father    . Heart disease Sister    . Heart disease Brother        ASSESSMENT/PLAN     Lori Arnold is a 66 y.o. female    Chief Complaint   Patient presents with   . URI     Onset 3 week        1. Upper respiratory tract infection, unspecified type  - POCT Influenza A/B    2. Sore throat  - POCT Rapid Group A Strep    3. Acute non-recurrent maxillary sinusitis  - clarithromycin (BIAXIN) 500 MG tablet; Take 0.5 tablets (250 mg total) by mouth 2 (two) times daily.for 10 days  Dispense: 10 tablet; Refill: 0    Discussed exam findings with patient.  Due to exam findings and length of illness antibiotics started.  Instructed patient to continue with maintenance medications(flonase), nasal irrigation. May use warm, moist compresses over the sinuses. Nasal saline spray or the use of a Neti Pot may also help relieve congestion symptoms or as a pre-treatment to a steroid nasal spray. May take Mucinex 600-1200mg  twice a day as needed. Recommend against the use of Afrin as this may cause rebound  congestion. Instead, chose an intranasal steroid (Nasacort, Nasonex, Flonase). Finish the entire course of antibiotics, even if symptoms improve. If you experience worsening of symptoms, a high fever, vision disturbances, swelling or redness around the eyes or severe headache, go to the emergency room. Patient agreeable with plan. All questions answered.    The differential diagnosis includes viral URI, pharyngitis, bronchitis, sinusitis, pneumonia, allergic rhinitis, RAD, post-nasal drip, or influenza.      Risk & Benefits of the new medication(s) were explained to the patient (and family) who verbalized understanding & agreed to the treatment plan. Patient (family) encouraged to contact me/clinical staff with any questions/concerns         Results for orders placed or performed in visit on 07/24/16   POCT Rapid Group A Strep   Result Value Ref Range  POCT QC Pass     Rapid Strep A Screen POCT Negative  Negative    Comment       Negative Results should be confirmed by throat Cx to confirm absence of Strep A inf.   POCT Influenza A/B   Result Value Ref Range    POCT QC Pass     POCT Rapid Influenza A AG Negative Negative    POCT Rapid Influenza B AG Negative Negative       Patient Instructions       Acute Bacterial Rhinosinusitis (ABRS)    Acute bacterial rhinosinusitis (ABRS) is an infection of your nasal cavity and sinuses. It's caused by bacteria. Acute means that you've had symptoms for less than 4 weeks, but possibly up to 12 weeks.  Understanding your sinuses  The nasal cavity is the large air-filled space behind your nose. The sinuses are a group of spaces formed by the bones of your face. They connect with your nasal cavity. ABRS causes the tissue lining these spaces to become inflamed. Mucus may not drain normally. This leads to facial pain and other symptoms.  What causes ABRS?  ABRS most often follows an upper respiratory infection caused by a virus. Bacteria then infect the lining of your nasal  cavity and sinuses. But you can also get ABRS if you have:   Nasal allergies   Long-term nasal swelling and congestion not caused by allergies   Blockage in the nose  Symptoms of ABRS  The symptoms of ABRS may be different for each person and include:   Nasal congestion or blockage   Pain or pressure in the face   Thick, colored drainage from the nose  Other symptoms may include:   Runny nose   Fluid draining from the nose down the throat (postnasal drip)   Headache   Cough   Pain   Fever  Diagnosing ABRS  ABRS may be diagnosed if you've had an upper respiratory infection like a cold and cough for 10 or more days without improvement or with worsening symptoms. Your healthcare provider will ask about your symptoms and your medical history. The provider will check your vital signs, including your temperature. You'll have a physical exam. The healthcare provider will check your ears, nose, and throat. You likely won't need any tests. If ABRS comes back, you may have a culture or other tests.  Treatment for ABRS  Treatment may include:   Antibiotic medicine. This is for symptoms that last for at least 10 to 14 days.   Nasal corticosteroid medicine. Drops or spray used in the nose can lessen swelling and congestion.   Over-the-counter pain medicine. This is to lessen sinus pain and pressure.   Nasal decongestant medicine. Spray or drops may help to lessen congestion. Do not use them for more than a few days.   Salt wash (saline irrigation). This can help to loosen mucus.  Possible complications of ABRS  ABRS may come back or become long-term (chronic). In rare cases, ABRS may cause complications such as:   Inflamed tissue around the brain and spinal cord (meningitis)   Inflamed tissue around the eyes (orbital cellulitis)   Inflamed bones around the sinuses (osteitis)  These problems may need to be treated in a hospital with intravenous (IV) antibiotic medicine or surgery.  When to call the healthcare  provider  Call your healthcare provider if you have any of the following:   Symptoms that don't get better, or get worse  Symptoms that don't get better after 3 to 5 days on antibiotics   Trouble seeing   Swelling around your eyes   Confusion or trouble staying awake   Date Last Reviewed: 10/10/2015   2000-2017 [employer] [address], [place], [place] [address]. All rights reserved. This information is not intended as a substitute for professional medical care. Always follow your healthcare professional's instructions.                MEDICATIONS     Current Outpatient Prescriptions   Medication Sig Dispense Refill   . acetaminophen-codeine (TYLENOL #3) 300-30 MG per tablet      . azaTHIOprine (IMURAN) 50 MG tablet      . buPROPion XL (WELLBUTRIN XL) 300 MG 24 hr tablet TAKE 1 TABLET BY MOUTH EVERY DAY IN THE MORNING 30 tablet 5   . cetirizine (ZYRTEC) 10 MG chewable tablet Chew 10 mg by mouth daily.     . Cholecalciferol (VITAMIN D3) 5000 UNITS Tab Take by mouth.     . clarithromycin (BIAXIN) 500 MG tablet Take 0.5 tablets (250 mg total) by mouth 2 (two) times daily.for 10 days 10 tablet 0   . fluticasone (FLONASE) 50 MCG/ACT nasal spray 1 spray by Nasal route as needed for Rhinitis.     . folic acid (FOLVITE) 1 MG tablet      . glucosamine-chondroitin 500-400 MG tablet Take 1 tablet by mouth 3 (three) times daily.     . hydroxychloroquine (PLAQUENIL) 200 MG tablet Take by mouth 2 (two) times daily.        Marland Kitchen levothyroxine (SYNTHROID, LEVOTHROID) 100 MCG tablet Take 1 tablet (100 mcg total) by mouth Once a day at 6:00am. 90 tablet 3   . liothyronine (CYTOMEL) 5 MCG tablet TAKE 1 TABLET (5 MCG TOTAL) BY MOUTH DAILY. 30 tablet 6   . metFORMIN (GLUCOPHAGE-XR) 500 MG 24 hr tablet TAKE 1 TABLET (500 MG TOTAL) BY MOUTH EVERY MORNING WITH BREAKFAST. 30 tablet 4   . Mirabegron ER (MYRBETRIQ) 50 MG Tablet SR 24 hr Take 50 mg by mouth daily. 30 tablet 5   . Mometasone Furo-Formoterol Fum 100-5 MCG/ACT  Aerosol Inhale into the lungs as needed.     . risedronate (ACTONEL) 150 MG tablet Take 150 mg by mouth every 30 (thirty) days. with water on empty stomach, nothing by mouth or lie down for next 30 minutes.     . valacyclovir (VALTREX) 1000 MG tablet Take 1 tablet (1,000 mg total) by mouth 2 (two) times daily. 14 tablet 5   . venlafaxine (EFFEXOR-XR) 75 MG 24 hr capsule TAKE 1 CAPSULE BY MOUTH EVERY DAY WITH FOOD 30 capsule 4   . zolpidem (AMBIEN) 5 MG tablet TAKE 1 TABLET (5 MG TOTAL) BY MOUTH NIGHTLY AS NEEDED FOR SLEEP. 30 tablet 2     No current facility-administered medications for this visit.        No Known Allergies    SUBJECTIVE     Chief Complaint   Patient presents with   . URI     Onset 3 week        URI    This is a new (Pt had flu shot already) problem. The current episode started 1 to 4 weeks ago. The problem has been gradually worsening. There has been no fever. Associated symptoms include congestion, coughing, ear pain, headaches, rhinorrhea, sinus pain, a sore throat and vomiting. Pertinent negatives include no nausea, swollen glands or wheezing. Associated  symptoms comments: Body ache, post nasal. She has tried nothing for the symptoms.       ROS     Review of Systems   Constitutional: Positive for fatigue. Negative for chills, diaphoresis and fever.   HENT: Positive for congestion, ear pain, rhinorrhea, sinus pain, sinus pressure and sore throat.    Eyes: Negative.    Respiratory: Positive for cough. Negative for shortness of breath and wheezing.    Cardiovascular: Negative.    Gastrointestinal: Positive for vomiting. Negative for nausea.   Neurological: Positive for headaches.   All other systems reviewed and are negative.      The following portions of the patient's history were reviewed and updated as appropriate: Allergies, Current Medications, Past Family History, Past Medical history, Past social history, Past surgical history, and Problem List.    PHYSICAL EXAM     Vitals:    07/24/16 1704    BP: 155/85   BP Site: Left arm   Patient Position: Sitting   Cuff Size: Large   Pulse: 83   Resp: 19   Temp: 99.3 F (37.4 C)   TempSrc: Oral   SpO2: 93%   Weight: 93 kg (205 lb)   Height: 1.626 m (5\' 4" )       Physical Exam   Constitutional: She is oriented to person, place, and time. Vital signs are normal. She appears well-developed and well-nourished. She is active and cooperative. She does not have a sickly appearance. She does not appear ill. No distress.   HENT:   Head: Normocephalic and atraumatic.   Right Ear: Tympanic membrane, external ear and ear canal normal.   Left Ear: Tympanic membrane, external ear and ear canal normal.   Nose: Mucosal edema, rhinorrhea and sinus tenderness present. Right sinus exhibits maxillary sinus tenderness. Right sinus exhibits no frontal sinus tenderness. Left sinus exhibits maxillary sinus tenderness. Left sinus exhibits no frontal sinus tenderness.   Mouth/Throat: Mucous membranes are normal. Posterior oropharyngeal erythema present. No posterior oropharyngeal edema.   Nasal mucosa erythematous and inflamed with associated nasal turbinate erythema and inflammation.      Neck: Normal range of motion. Neck supple.   Pulmonary/Chest: Effort normal and breath sounds normal. She has no decreased breath sounds. She has no wheezes. She has no rhonchi.   Lymphadenopathy:        Head (right side): No submental, no submandibular, no tonsillar, no preauricular, no posterior auricular and no occipital adenopathy present.        Head (left side): No submental, no submandibular, no tonsillar, no preauricular, no posterior auricular and no occipital adenopathy present.     She has no cervical adenopathy.        Right cervical: No superficial cervical adenopathy present.       Left cervical: No superficial cervical adenopathy present.   Neurological: She is alert and oriented to person, place, and time.   Skin: Skin is warm, dry and intact.   Nursing note and vitals reviewed.    Ortho  Exam  Neurologic Exam     Mental Status   Oriented to person, place, and time.       PROCEDURE(S)     Procedures        Signed,  Mia Creek, FNP  07/24/2016

## 2016-07-24 NOTE — Patient Instructions (Signed)
Acute Bacterial Rhinosinusitis (ABRS)    Acute bacterial rhinosinusitis (ABRS) is an infection of your nasal cavity and sinuses. It's caused by bacteria. Acute means that you've had symptoms for less than 4 weeks, but possibly up to 12 weeks.  Understanding your sinuses  The nasal cavity is the large air-filled space behind your nose. The sinuses are a group of spaces formed by the bones of your face. They connect with your nasal cavity. ABRS causes the tissue lining these spaces to become inflamed. Mucus may not drain normally. This leads to facial pain and other symptoms.  What causes ABRS?  ABRS most often follows an upper respiratory infection caused by a virus. Bacteria then infect the lining of your nasal cavity and sinuses. But you can also get ABRS if you have:   Nasal allergies   Long-term nasal swelling and congestion not caused by allergies   Blockage in the nose  Symptoms of ABRS  The symptoms of ABRS may be different for each person and include:   Nasal congestion or blockage   Pain or pressure in the face   Thick, colored drainage from the nose  Other symptoms may include:   Runny nose   Fluid draining from the nose down the throat (postnasal drip)   Headache   Cough   Pain   Fever  Diagnosing ABRS  ABRS may be diagnosed if you've had an upper respiratory infection like a cold and cough for 10 or more days without improvement or with worsening symptoms. Your healthcare provider will ask about your symptoms and your medical history. The provider will check your vital signs, including your temperature. You'll have a physical exam. The healthcare provider will check your ears, nose, and throat. You likely won't need any tests. If ABRS comes back, you may have a culture or other tests.  Treatment for ABRS  Treatment may include:   Antibiotic medicine. This is for symptoms that last for at least 10 to 14 days.   Nasal corticosteroid medicine. Drops or spray used in the nose can lessen  swelling and congestion.   Over-the-counter pain medicine. This is to lessen sinus pain and pressure.   Nasal decongestant medicine. Spray or drops may help to lessen congestion. Do not use them for more than a few days.   Salt wash (saline irrigation). This can help to loosen mucus.  Possible complications of ABRS  ABRS may come back or become long-term (chronic). In rare cases, ABRS may cause complications such as:   Inflamed tissue around the brain and spinal cord (meningitis)   Inflamed tissue around the eyes (orbital cellulitis)   Inflamed bones around the sinuses (osteitis)  These problems may need to be treated in a hospital with intravenous (IV) antibiotic medicine or surgery.  When to call the healthcare provider  Call your healthcare provider if you have any of the following:   Symptoms that don't get better, or get worse   Symptoms that don't get better after 3 to 5 days on antibiotics   Trouble seeing   Swelling around your eyes   Confusion or trouble staying awake   Date Last Reviewed: 10/10/2015   2000-2017 [employer] [address], [place] [address]. All rights reserved. This information is not intended as a substitute for professional medical care. Always follow your healthcare professional's instructions.

## 2016-07-25 ENCOUNTER — Ambulatory Visit (INDEPENDENT_AMBULATORY_CARE_PROVIDER_SITE_OTHER): Payer: PRIVATE HEALTH INSURANCE | Admitting: Internal Medicine

## 2016-07-25 ENCOUNTER — Encounter (INDEPENDENT_AMBULATORY_CARE_PROVIDER_SITE_OTHER): Payer: Self-pay | Admitting: Internal Medicine

## 2016-07-25 VITALS — BP 143/84 | HR 94 | Temp 98.3°F | Resp 16 | Wt 188.0 lb

## 2016-07-25 DIAGNOSIS — D649 Anemia, unspecified: Secondary | ICD-10-CM

## 2016-07-25 DIAGNOSIS — E039 Hypothyroidism, unspecified: Secondary | ICD-10-CM

## 2016-07-25 DIAGNOSIS — R7303 Prediabetes: Secondary | ICD-10-CM

## 2016-07-25 MED ORDER — LEVOTHYROXINE SODIUM 100 MCG PO TABS
100.0000 ug | ORAL_TABLET | Freq: Every day | ORAL | 3 refills | Status: DC
Start: 2016-07-25 — End: 2017-03-21

## 2016-07-25 NOTE — Progress Notes (Signed)
Subjective:      Date: 07/25/2016 5:50 PM   Patient ID: Lori Arnold is a 66 y.o. female.    Chief Complaint:  Chief Complaint   Patient presents with   . Hypothyroidism       HPI  Visit type: follow-up - routine clinic  Type: Primary  Manifestation: No goiter present  Diagnosis: diagnosed several years ago  Last follow-up: 6 months ago  Current symptoms: no current symptoms  Patient denies: any symptoms, including fatigue, weight changes, constipation, hyperthyroid symptoms, cold/heat intolerance or changes in hair, nails or skin  Exacerbating factors: none  Relieving factors: thyroid hormone replacement  Current medications: levothyroxine 100 mcg and cytomel (liothyronine) 5 mcg  Laboratory Testing:   Lab Results   Component Value Date    TSH 4.51 03/23/2016     In 2017, TSH was decreased which prompted decreasing Levothyoxine dose.  Pt had labs performed at rheumatology which demonstrated post-THR anemia and TSH elevated.    Pt has prediabetes - actively trying to modify diet.  Pt is getting back to exercise s/p THR.      Problem List:  Patient Active Problem List   Diagnosis   . Fibroids   . Prediabetes   . Lupus   . Hypothyroidism       Current Medications:  Current Outpatient Prescriptions   Medication Sig Dispense Refill   . acetaminophen-codeine (TYLENOL #3) 300-30 MG per tablet      . buPROPion XL (WELLBUTRIN XL) 300 MG 24 hr tablet TAKE 1 TABLET BY MOUTH EVERY DAY IN THE MORNING 30 tablet 5   . clarithromycin (BIAXIN) 500 MG tablet Take 0.5 tablets (250 mg total) by mouth 2 (two) times daily.for 10 days 10 tablet 0   . fluticasone (FLONASE) 50 MCG/ACT nasal spray 1 spray by Nasal route as needed for Rhinitis.     . folic acid (FOLVITE) 1 MG tablet      . glucosamine-chondroitin 500-400 MG tablet Take 1 tablet by mouth 3 (three) times daily.     . hydroxychloroquine (PLAQUENIL) 200 MG tablet Take by mouth 2 (two) times daily.        Marland Kitchen levothyroxine (SYNTHROID, LEVOTHROID) 100 MCG tablet Take 1 tablet (100  mcg total) by mouth Once a day at 6:00am. 90 tablet 3   . liothyronine (CYTOMEL) 5 MCG tablet TAKE 1 TABLET (5 MCG TOTAL) BY MOUTH DAILY. 30 tablet 6   . metFORMIN (GLUCOPHAGE-XR) 500 MG 24 hr tablet TAKE 1 TABLET (500 MG TOTAL) BY MOUTH EVERY MORNING WITH BREAKFAST. 30 tablet 4   . Mirabegron ER (MYRBETRIQ) 50 MG Tablet SR 24 hr Take 50 mg by mouth daily. 30 tablet 5   . risedronate (ACTONEL) 150 MG tablet Take 150 mg by mouth every 30 (thirty) days. with water on empty stomach, nothing by mouth or lie down for next 30 minutes.     . valacyclovir (VALTREX) 1000 MG tablet Take 1 tablet (1,000 mg total) by mouth 2 (two) times daily. 14 tablet 5   . venlafaxine (EFFEXOR-XR) 75 MG 24 hr capsule TAKE 1 CAPSULE BY MOUTH EVERY DAY WITH FOOD 30 capsule 4   . cetirizine (ZYRTEC) 10 MG chewable tablet Chew 10 mg by mouth daily.     Marland Kitchen zolpidem (AMBIEN) 5 MG tablet TAKE 1 TABLET (5 MG TOTAL) BY MOUTH NIGHTLY AS NEEDED FOR SLEEP. 30 tablet 2     No current facility-administered medications for this visit.  Allergies:  No Known Allergies    Past Medical History:  Past Medical History:   Diagnosis Date   . Anxiety    . Arthritis    . Depression    . Hypothyroidism     TAKES CYTOMEL & SYNTHROID   . Lupus     HAS ARTHRITIC TYPE SYMPTOMS   . Prediabetes        Past Surgical History:  Past Surgical History:   Procedure Laterality Date   . COLONOSCOPY  06/2013   . DE QUERVAIN'S RELEASE Bilateral 1988   . DILATION & CURRETAGE  1984    AFTER A STILLBORN   . ROBOT ASSISTED, SINGLE SITE, LAPAROSCOPIC, HYSTERECTOMY, TOTAL, BSO N/A 12/30/2014    Procedure: ROBOT ASSISTED, LAPAROSCOPIC, SINGLE SITE, HYSTERECTOMY, TOTAL BSO WITH CYSTOSCOPY;  Surgeon: Orson Aloe, MD;  Location: [facility];  Service: Gynecology;  Laterality: N/A;  ROBOT ASSISTED, LAPAROSCOPIC, SINGLE SITE, HYSTERECTOMY, TOTAL BSO WITH CYSTOSCOPY   . TONSILLECTOMY  1959   . TRIGGER FINGER RELEASE Bilateral [address] & 2014       Family History:  Family History    Problem Relation Age of Onset   . Heart disease Mother    . Cancer Father    . Heart disease Sister    . Heart disease Brother        Social History:  Social History     Social History   . Marital status: Divorced     Spouse name: N/A   . Number of children: N/A   . Years of education: N/A     Occupational History   . Not on file.     Social History Main Topics   . Smoking status: Former Smoker     Packs/day: 1.00     Years: 25.00     Quit date: 12/09/1994   . Smokeless tobacco: Never Used   . Alcohol use 1.2 oz/week     2 Shots of liquor per week   . Drug use: No   . Sexual activity: No     Other Topics Concern   . Not on file     Social History Narrative   . No narrative on file       The following sections were reviewed this encounter by the provider:   Tobacco  Allergies  Meds  Problems  Med Hx  Surg Hx  Fam Hx  Soc Hx          Vitals:  BP 143/84   Pulse 94   Temp 98.3 F (36.8 C) (Oral)   Resp 16   Wt 85.3 kg (188 lb)   SpO2 97%   BMI 32.27 kg/m    ROS:  General/Constitutional:   Denies Chills. Denies Fatigue. Denies Fever.   Ophthalmologic:   Denies Blurred vision.   ENT:   Denies Nasal Discharge. Denies Sinus pain. Denies Sore throat.   Respiratory:   Denies Cough. Denies Shortness of breath. Denies Wheezing.   Cardiovascular:   Denies Chest pain. Denies Chest pain with exertion. Denies Palpitations. Denies  Swelling in hands/feet.   Gastrointestinal:   Denies Abdominal pain. Denies Constipation. Denies Diarrhea. Denies Nausea. Denies  Vomiting.   Skin:   Denies Rash.   Neurologic:   Denies Dizziness. Denies Headache. Denies Tingling/Numbness.        Objective:       Physical Exam:  General Examination:   GENERAL APPEARANCE: alert, in no acute distress, well developed, well nourished  oriented to  time, place, and person.   ORAL CAVITY: normal oropharynx, normal lips, mucosa moist.   NECK/THYROID: neck supple, no carotid bruit, carotid pulse 2+ bilaterally, nono neck mass palpated, no jugular  venous distention, no thyromegaly.  LYMPH: no cervical/supraclavicular adenopathy   HEART: S1, S2 normal, no murmurs, rubs, gallops, regular rate and rhythm.   LUNGS: normal effort / no distress, normal breath sounds, clear to auscultation  bilaterally, no wheezes, rales, rhonchi.   EXTREMITIES: No LE edema bilaterally.   PERIPHERAL PULSES: 2+ dorsalis pedis, 2+ posterior tibial bilaterally.   PSYCH: alert and oriented to time,place and person.   SKIN: moist, no focal rash     Assessment:       1. Acquired hypothyroidism  - TSH; Future  - T4, free; Future  - T3, free; Future  - levothyroxine (SYNTHROID, LEVOTHROID) 100 MCG tablet; Take 1 tablet (100 mcg total) by mouth Once a day at 6:00am.  Dispense: 90 tablet; Refill: 3    2. Prediabetes  - Comprehensive metabolic panel; Future  - Hemoglobin A1C; Future    3. Anemia, unspecified type  - CBC and differential; Future        Plan:     Hypothyroidism:  Lab Results   Component Value Date    TSH 4.51 03/23/2016    FREET3 2.78 03/23/2016    T4FREE 1.22 03/23/2016     Pt is chemically hypothyroid with TSH elevated on recent rheumatologic testing.  Recommend TRADE Synthroid to eliminate possibility of drug variance. Continue current thyroid replacement medication dosing regimen. Obtain TFT's in 6 weeks    Prediabetes:  Lab Results   Component Value Date    HGBA1C 5.4 03/23/2016    HGBA1C 5.7 (H) 07/18/2015    HGBA1C 6.0 (H) 03/11/2015     A1C has improved compared to prior results.  Continue to optimize low carbohydrate diet and aerobic exercise measures. Reviewed 2017 prediabetic lab indices with patient.    Anemia:  Post-op THR anemia - repeat CBC in 6 weeks.  Pre-surgical CBC demonstrated normal H/H.  B12, Folate, iron indices WNL.                    Sandford Craze, MD

## 2016-08-09 ENCOUNTER — Other Ambulatory Visit: Payer: PRIVATE HEALTH INSURANCE

## 2016-08-23 ENCOUNTER — Other Ambulatory Visit (FREE_STANDING_LABORATORY_FACILITY): Payer: PRIVATE HEALTH INSURANCE

## 2016-08-23 DIAGNOSIS — E039 Hypothyroidism, unspecified: Secondary | ICD-10-CM

## 2016-08-23 DIAGNOSIS — D649 Anemia, unspecified: Secondary | ICD-10-CM

## 2016-08-23 DIAGNOSIS — R7303 Prediabetes: Secondary | ICD-10-CM

## 2016-08-23 LAB — COMPREHENSIVE METABOLIC PANEL
ALT: 17 U/L (ref 0–55)
AST (SGOT): 25 U/L (ref 5–34)
Albumin/Globulin Ratio: 1.1 (ref 0.9–2.2)
Albumin: 3.8 g/dL (ref 3.5–5.0)
Alkaline Phosphatase: 125 U/L — ABNORMAL HIGH (ref 37–106)
BUN: 20 mg/dL — ABNORMAL HIGH (ref 7.0–19.0)
Bilirubin, Total: 0.2 mg/dL (ref 0.1–1.2)
CO2: 27 mEq/L (ref 21–29)
Calcium: 10 mg/dL (ref 8.5–10.5)
Chloride: 104 mEq/L (ref 100–111)
Creatinine: 0.8 mg/dL (ref 0.4–1.5)
Globulin: 3.4 g/dL (ref 2.0–3.7)
Glucose: 107 mg/dL — ABNORMAL HIGH (ref 70–100)
Potassium: 5.1 mEq/L (ref 3.5–5.1)
Protein, Total: 7.2 g/dL (ref 6.0–8.3)
Sodium: 141 mEq/L (ref 136–145)

## 2016-08-23 LAB — HEMOGLOBIN A1C
Average Estimated Glucose: 116.9 mg/dL
Hemoglobin A1C: 5.7 % (ref 4.6–5.9)

## 2016-08-23 LAB — CBC AND DIFFERENTIAL
Absolute NRBC: 0 10*3/uL
Basophils Absolute Automated: 0.05 10*3/uL (ref 0.00–0.20)
Basophils Automated: 1 %
Eosinophils Absolute Automated: 0.15 10*3/uL (ref 0.00–0.70)
Eosinophils Automated: 3.1 %
Hematocrit: 40.6 % (ref 37.0–47.0)
Hgb: 11.7 g/dL — ABNORMAL LOW (ref 12.0–16.0)
Immature Granulocytes Absolute: 0.01 10*3/uL
Immature Granulocytes: 0.2 %
Lymphocytes Absolute Automated: 1.61 10*3/uL (ref 0.50–4.40)
Lymphocytes Automated: 33.3 %
MCH: 28.5 pg (ref 28.0–32.0)
MCHC: 28.8 g/dL — ABNORMAL LOW (ref 32.0–36.0)
MCV: 98.8 fL (ref 80.0–100.0)
MPV: 9.4 fL (ref 9.4–12.3)
Monocytes Absolute Automated: 0.45 10*3/uL (ref 0.00–1.20)
Monocytes: 9.3 %
Neutrophils Absolute: 2.56 10*3/uL (ref 1.80–8.10)
Neutrophils: 53.1 %
Nucleated RBC: 0 /100 WBC (ref 0.0–1.0)
Platelets: 404 10*3/uL — ABNORMAL HIGH (ref 140–400)
RBC: 4.11 10*6/uL — ABNORMAL LOW (ref 4.20–5.40)
RDW: 16 % — ABNORMAL HIGH (ref 12–15)
WBC: 4.83 10*3/uL (ref 3.50–10.80)

## 2016-08-23 LAB — TSH: TSH: 4.25 u[IU]/mL (ref 0.35–4.94)

## 2016-08-23 LAB — HEMOLYSIS INDEX: Hemolysis Index: 36 — ABNORMAL HIGH (ref 0–18)

## 2016-08-23 LAB — T4, FREE: T4 Free: 1.05 ng/dL (ref 0.70–1.48)

## 2016-08-23 LAB — GFR: EGFR: >60

## 2016-08-23 LAB — T3, FREE: T3, Free: 2.78 pg/mL (ref 1.71–3.71)

## 2016-08-27 ENCOUNTER — Other Ambulatory Visit (INDEPENDENT_AMBULATORY_CARE_PROVIDER_SITE_OTHER): Payer: Self-pay

## 2016-08-27 DIAGNOSIS — D649 Anemia, unspecified: Secondary | ICD-10-CM

## 2016-08-27 NOTE — Progress Notes (Signed)
Pt notified of lab results and recommendations-verbalized understanding.-Call transferred to schedule lab app for repeat CBC and additional labs.

## 2016-08-28 ENCOUNTER — Other Ambulatory Visit (FREE_STANDING_LABORATORY_FACILITY): Payer: PRIVATE HEALTH INSURANCE

## 2016-08-28 DIAGNOSIS — D649 Anemia, unspecified: Secondary | ICD-10-CM

## 2016-08-28 LAB — CBC AND DIFFERENTIAL
Absolute NRBC: 0 10*3/uL
Basophils Absolute Automated: 0.04 10*3/uL (ref 0.00–0.20)
Basophils Automated: 0.7 %
Eosinophils Absolute Automated: 0.13 10*3/uL (ref 0.00–0.70)
Eosinophils Automated: 2.4 %
Hematocrit: 37.6 % (ref 37.0–47.0)
Hgb: 11.4 g/dL — ABNORMAL LOW (ref 12.0–16.0)
Immature Granulocytes Absolute: 0.01 10*3/uL
Immature Granulocytes: 0.2 %
Lymphocytes Absolute Automated: 1.52 10*3/uL (ref 0.50–4.40)
Lymphocytes Automated: 27.9 %
MCH: 28.5 pg (ref 28.0–32.0)
MCHC: 30.3 g/dL — ABNORMAL LOW (ref 32.0–36.0)
MCV: 94 fL (ref 80.0–100.0)
MPV: 9 fL — ABNORMAL LOW (ref 9.4–12.3)
Monocytes Absolute Automated: 0.65 10*3/uL (ref 0.00–1.20)
Monocytes: 11.9 %
Neutrophils Absolute: 3.09 10*3/uL (ref 1.80–8.10)
Neutrophils: 56.9 %
Nucleated RBC: 0 /100 WBC (ref 0.0–1.0)
Platelets: 382 10*3/uL (ref 140–400)
RBC: 4 10*6/uL — ABNORMAL LOW (ref 4.20–5.40)
RDW: 17 % — ABNORMAL HIGH (ref 12–15)
WBC: 5.44 10*3/uL (ref 3.50–10.80)

## 2016-08-28 LAB — FERRITIN: Ferritin: 42.03 ng/mL (ref 4.60–204.00)

## 2016-08-28 LAB — IRON PROFILE
Iron Saturation: 13 % — ABNORMAL LOW (ref 15–50)
Iron: 57 ug/dL (ref 40–145)
TIBC: 446 ug/dL (ref 265–497)
UIBC: 389 ug/dL — ABNORMAL HIGH (ref 126–382)

## 2016-08-28 LAB — RETICULOCYTES
Immature Retic Fract: 14.7 % (ref 3.0–15.9)
Reticulocyte Count Absolute: 0.0672 10*6/uL (ref 0.0210–0.1350)
Reticulocyte Count Automated: 1.7 % (ref 0.5–2.5)
Reticulocyte Hemoglobin: 31.5 pg (ref 28.2–36.6)

## 2016-08-28 LAB — VITAMIN B12: Vitamin B-12: 605 pg/mL (ref 211–911)

## 2016-08-28 LAB — FOLATE: Folate: >20 ng/mL

## 2016-08-28 LAB — HEMOLYSIS INDEX: Hemolysis Index: 1 (ref 0–18)

## 2016-08-28 NOTE — Progress Notes (Signed)
Orders placed in Epic as requested

## 2016-08-30 LAB — METHYLMALONIC ACID, SERUM: Methylmalonic, Acid: 177 nmol/L (ref 87–318)

## 2016-09-03 ENCOUNTER — Other Ambulatory Visit (INDEPENDENT_AMBULATORY_CARE_PROVIDER_SITE_OTHER): Payer: Self-pay | Admitting: Internal Medicine

## 2016-09-04 ENCOUNTER — Telehealth (INDEPENDENT_AMBULATORY_CARE_PROVIDER_SITE_OTHER): Payer: Self-pay | Admitting: Internal Medicine

## 2016-09-04 DIAGNOSIS — D649 Anemia, unspecified: Secondary | ICD-10-CM

## 2016-09-04 NOTE — Telephone Encounter (Signed)
Per your recommendations I have pre-filled for 3 month follow up BW for patient.

## 2016-09-06 ENCOUNTER — Encounter (INDEPENDENT_AMBULATORY_CARE_PROVIDER_SITE_OTHER): Payer: Self-pay | Admitting: Internal Medicine

## 2016-09-21 ENCOUNTER — Other Ambulatory Visit (INDEPENDENT_AMBULATORY_CARE_PROVIDER_SITE_OTHER): Payer: Self-pay | Admitting: Internal Medicine

## 2016-10-22 ENCOUNTER — Other Ambulatory Visit (INDEPENDENT_AMBULATORY_CARE_PROVIDER_SITE_OTHER): Payer: Self-pay | Admitting: Internal Medicine

## 2016-12-17 ENCOUNTER — Encounter (INDEPENDENT_AMBULATORY_CARE_PROVIDER_SITE_OTHER): Payer: Self-pay | Admitting: Internal Medicine

## 2016-12-18 ENCOUNTER — Ambulatory Visit (INDEPENDENT_AMBULATORY_CARE_PROVIDER_SITE_OTHER): Payer: PRIVATE HEALTH INSURANCE | Admitting: Internal Medicine

## 2016-12-18 ENCOUNTER — Encounter (INDEPENDENT_AMBULATORY_CARE_PROVIDER_SITE_OTHER): Payer: Self-pay | Admitting: Internal Medicine

## 2016-12-18 VITALS — BP 155/81 | HR 96 | Temp 98.3°F | Resp 18 | Ht 64.0 in | Wt 215.0 lb

## 2016-12-18 DIAGNOSIS — R222 Localized swelling, mass and lump, trunk: Secondary | ICD-10-CM

## 2016-12-18 NOTE — Progress Notes (Signed)
Subjective:      Date: 12/18/2016 4:56 PM   Patient ID: Lori Arnold is a 66 y.o. female.    Chief Complaint:  Chief Complaint   Patient presents with   . Mass     abdomen       HPI:  HPI   New problem  Patient here for lump in her stomach for one month  Slowly increasing in size  No change with BM  Dull ache but pain has woke up pt at night twice  No alleviating or illiciting factors  No associated symptoms      Problem List:  Patient Active Problem List   Diagnosis   . Fibroids   . Prediabetes   . Lupus   . Hypothyroidism       Current Medications:  Current Outpatient Prescriptions   Medication Sig Dispense Refill   . buPROPion XL (WELLBUTRIN XL) 300 MG 24 hr tablet TAKE 1 TABLET BY MOUTH EVERY DAY IN THE MORNING 30 tablet 4   . cetirizine (ZYRTEC) 10 MG chewable tablet Chew 10 mg by mouth daily.     . fluticasone (FLONASE) 50 MCG/ACT nasal spray 1 spray by Nasal route as needed for Rhinitis.     . folic acid (FOLVITE) 1 MG tablet      . glucosamine-chondroitin 500-400 MG tablet Take 1 tablet by mouth 3 (three) times daily.     . hydroxychloroquine (PLAQUENIL) 200 MG tablet Take by mouth 2 (two) times daily.        Marland Kitchen levothyroxine (SYNTHROID, LEVOTHROID) 100 MCG tablet Take 1 tablet (100 mcg total) by mouth Once a day at 6:00am. 90 tablet 3   . liothyronine (CYTOMEL) 5 MCG tablet TAKE 1 TABLET (5 MCG TOTAL) BY MOUTH DAILY. 30 tablet 5   . metFORMIN (GLUCOPHAGE-XR) 500 MG 24 hr tablet TAKE 1 TABLET EVERY MORNING WITH BREAKFAST (Patient taking differently: TAKE 1 TABLET EVERY MORNING qhs) 30 tablet 3   . Mirabegron ER (MYRBETRIQ) 50 MG Tablet SR 24 hr Take 50 mg by mouth daily. 30 tablet 5   . valacyclovir (VALTREX) 1000 MG tablet Take 1 tablet (1,000 mg total) by mouth 2 (two) times daily. 14 tablet 5   . venlafaxine (EFFEXOR-XR) 75 MG 24 hr capsule TAKE 1 CAPSULE BY MOUTH EVERY DAY WITH FOOD 30 capsule 4     No current facility-administered medications for this visit.        Allergies:  No Known  Allergies    Past Medical History:  Past Medical History:   Diagnosis Date   . Anxiety    . Arthritis    . Depression    . Hypothyroidism     TAKES CYTOMEL & SYNTHROID   . Lupus     HAS ARTHRITIC TYPE SYMPTOMS   . Prediabetes        Past Surgical History:  Past Surgical History:   Procedure Laterality Date   . COLONOSCOPY  06/2013   . DE QUERVAIN'S RELEASE Bilateral 1988   . DILATION & CURRETAGE  1984    AFTER A STILLBORN   . JOINT REPLACEMENT      hip   . ROBOT ASSISTED, SINGLE SITE, LAPAROSCOPIC, HYSTERECTOMY, TOTAL, BSO N/A 12/30/2014    Procedure: ROBOT ASSISTED, LAPAROSCOPIC, SINGLE SITE, HYSTERECTOMY, TOTAL BSO WITH CYSTOSCOPY;  Surgeon: Orson Aloe, MD;  Location: [facility];  Service: Gynecology;  Laterality: N/A;  ROBOT ASSISTED, LAPAROSCOPIC, SINGLE SITE, HYSTERECTOMY, TOTAL BSO WITH CYSTOSCOPY   . TONSILLECTOMY  39   . TRIGGER FINGER RELEASE Bilateral [id number] & 2014       Family History:  Family History   Problem Relation Age of Onset   . Heart disease Mother    . Cancer Father    . Heart disease Sister    . Heart disease Brother        Social History:  Social History     Social History   . Marital status: Divorced     Spouse name: N/A   . Number of children: N/A   . Years of education: N/A     Occupational History   . Not on file.     Social History Main Topics   . Smoking status: Former Smoker     Packs/day: 1.00     Years: 25.00     Quit date: 12/09/1994   . Smokeless tobacco: Never Used   . Alcohol use 1.2 oz/week     2 Shots of liquor per week   . Drug use: No   . Sexual activity: No     Other Topics Concern   . Not on file     Social History Narrative   . No narrative on file       The following sections were reviewed this encounter by the provider:   Tobacco  Allergies  Meds  Problems  Med Hx  Surg Hx  Fam Hx  Soc Hx          Vitals:  BP 155/81 (BP Site: Left arm, Patient Position: Sitting, Cuff Size: Large)   Pulse 96   Temp 98.3 F (36.8 C) (Oral)   Resp 18   Ht 1.626 m (5'  4")   Wt 97.5 kg (215 lb)   SpO2 96%   BMI 36.90 kg/m          ROS:  Review of Systems   Constitutional: Positive for fatigue. Negative for chills and fever.   Respiratory: Negative for cough, shortness of breath and wheezing.    Cardiovascular: Negative for chest pain and palpitations.   Gastrointestinal: Positive for abdominal distention, abdominal pain, constipation and nausea. Negative for blood in stool, diarrhea and vomiting.         Objective:       Physical Exam:  General Examination:   GENERAL APPEARANCE: alert, in no acute distress, well developed, well nourished, oriented to time, place, and person.   HEAD: normal appearance, atraumatic.   EYES: extraocular movement intact (EOMI), pupils equal, round, reactive to light and accommodation, sclera anicteric, conjunctiva clear.    ORAL CAVITY: normal oropharynx, normal lips, mucosa moist, no lesions.   THROAT: normal appearance, clear, no erythema.   NECK/THYROID: neck supple,  no cervical lymphadenopathy, no neck mass palpated, no thyromegaly.   LYMPH NODES: no palpable adenopathy.   SKIN: good turgor, no rashes, no suspicious lesions.   HEART: S1, S2 normal, no murmurs, rubs, gallops, regular rate and rhythm.   LUNGS: normal effort / no distress, normal breath sounds, clear to auscultation bilaterally, no wheezes, rales, rhonchi.   ABDOMEN: bowel sounds present, no hepatosplenomegaly, soft, nontender, mild bulge above umbilicus with bulge in umbilicus, increases in size with valsalva .   MUSCULOSKELETAL: full range of motion, no swelling or deformity.   EXTREMITIES: no edema,.   NEUROLOGIC: nonfocal, cranial nerves 2-12 grossly intact, normal strength and tone, sensory exam intact.   PSYCH: cognitive function intact, mood/affect full range, speech clear.      Assessment/Plan:  1. Abdominal wall mass  - CT Abdomen W IV And PO Cont; Future  possible hernia  If CT pos, will refer pt to surgery        Beacher May, MD

## 2016-12-19 ENCOUNTER — Encounter (INDEPENDENT_AMBULATORY_CARE_PROVIDER_SITE_OTHER): Payer: Self-pay | Admitting: Internal Medicine

## 2016-12-20 ENCOUNTER — Other Ambulatory Visit (INDEPENDENT_AMBULATORY_CARE_PROVIDER_SITE_OTHER): Payer: Self-pay | Admitting: Internal Medicine

## 2016-12-20 DIAGNOSIS — K429 Umbilical hernia without obstruction or gangrene: Secondary | ICD-10-CM

## 2016-12-20 DIAGNOSIS — R222 Localized swelling, mass and lump, trunk: Secondary | ICD-10-CM

## 2016-12-20 DIAGNOSIS — R911 Solitary pulmonary nodule: Secondary | ICD-10-CM

## 2016-12-28 ENCOUNTER — Encounter (INDEPENDENT_AMBULATORY_CARE_PROVIDER_SITE_OTHER): Payer: Self-pay | Admitting: Internal Medicine

## 2016-12-28 ENCOUNTER — Other Ambulatory Visit (INDEPENDENT_AMBULATORY_CARE_PROVIDER_SITE_OTHER): Payer: Self-pay | Admitting: Internal Medicine

## 2016-12-28 DIAGNOSIS — R911 Solitary pulmonary nodule: Secondary | ICD-10-CM

## 2016-12-31 ENCOUNTER — Telehealth (INDEPENDENT_AMBULATORY_CARE_PROVIDER_SITE_OTHER): Payer: Self-pay | Admitting: Internal Medicine

## 2016-12-31 NOTE — Telephone Encounter (Signed)
Please send pt letter with my email message as the pt did not read the email.    You have a few new small nodules in your lungs.   One option is to get a different type of CT now and then repeat the CT in 6 months. However, I think it would be better to send you to a pulmonologist to get their opinion.   Please call the office and ask to speak with Kennon Rounds to get the referral. You should pick up a copy of the CT reading when you pick up the referral.   Also, you should go to radiology and get a copy of the films. Bring them with you when you go to the appointment with the pulmonologist so they can look at the actual films, not just the report.

## 2017-01-02 ENCOUNTER — Encounter (INDEPENDENT_AMBULATORY_CARE_PROVIDER_SITE_OTHER): Payer: Self-pay

## 2017-01-02 NOTE — Telephone Encounter (Signed)
Letter and referral sent to patient

## 2017-01-05 ENCOUNTER — Other Ambulatory Visit (INDEPENDENT_AMBULATORY_CARE_PROVIDER_SITE_OTHER): Payer: Self-pay | Admitting: Internal Medicine

## 2017-01-06 ENCOUNTER — Encounter (INDEPENDENT_AMBULATORY_CARE_PROVIDER_SITE_OTHER): Payer: Self-pay | Admitting: Internal Medicine

## 2017-01-07 ENCOUNTER — Telehealth (INDEPENDENT_AMBULATORY_CARE_PROVIDER_SITE_OTHER): Payer: Self-pay | Admitting: Internal Medicine

## 2017-01-07 NOTE — Telephone Encounter (Signed)
Patient called requesting to get a EKG for her surgery on Friday. Patient has scheduled for Tuesday. Please add EKG order. Patient would like a call back.

## 2017-01-08 ENCOUNTER — Encounter (INDEPENDENT_AMBULATORY_CARE_PROVIDER_SITE_OTHER): Payer: Self-pay | Admitting: Internal Medicine

## 2017-01-08 ENCOUNTER — Ambulatory Visit (INDEPENDENT_AMBULATORY_CARE_PROVIDER_SITE_OTHER): Payer: PRIVATE HEALTH INSURANCE

## 2017-01-08 ENCOUNTER — Ambulatory Visit (INDEPENDENT_AMBULATORY_CARE_PROVIDER_SITE_OTHER): Payer: PRIVATE HEALTH INSURANCE | Admitting: Internal Medicine

## 2017-01-08 VITALS — BP 137/87 | HR 89 | Temp 98.5°F | Resp 18 | Ht 64.0 in | Wt 213.0 lb

## 2017-01-08 DIAGNOSIS — K429 Umbilical hernia without obstruction or gangrene: Secondary | ICD-10-CM

## 2017-01-08 DIAGNOSIS — Z01818 Encounter for other preprocedural examination: Secondary | ICD-10-CM

## 2017-01-08 LAB — CBC AND DIFFERENTIAL
Absolute NRBC: 0 10*3/uL
Basophils Absolute Automated: 0.06 10*3/uL (ref 0.00–0.20)
Basophils Automated: 1.2 %
Eosinophils Absolute Automated: 0.19 10*3/uL (ref 0.00–0.70)
Eosinophils Automated: 3.7 %
Hematocrit: 41.1 % (ref 37.0–47.0)
Hgb: 13.1 g/dL (ref 12.0–16.0)
Immature Granulocytes Absolute: 0.02 10*3/uL
Immature Granulocytes: 0.4 %
Lymphocytes Absolute Automated: 1.19 10*3/uL (ref 0.50–4.40)
Lymphocytes Automated: 22.9 %
MCH: 34.3 pg — ABNORMAL HIGH (ref 28.0–32.0)
MCHC: 31.9 g/dL — ABNORMAL LOW (ref 32.0–36.0)
MCV: 107.6 fL — ABNORMAL HIGH (ref 80.0–100.0)
MPV: 9.1 fL — ABNORMAL LOW (ref 9.4–12.3)
Monocytes Absolute Automated: 0.54 10*3/uL (ref 0.00–1.20)
Monocytes: 10.4 %
Neutrophils Absolute: 3.19 10*3/uL (ref 1.80–8.10)
Neutrophils: 61.4 %
Nucleated RBC: 0 /100 WBC (ref 0.0–1.0)
Platelets: 332 10*3/uL (ref 140–400)
RBC: 3.82 10*6/uL — ABNORMAL LOW (ref 4.20–5.40)
RDW: 15 % (ref 12–15)
WBC: 5.19 10*3/uL (ref 3.50–10.80)

## 2017-01-08 NOTE — Telephone Encounter (Signed)
Pt has pre-op appt 01/08/17.

## 2017-01-08 NOTE — Progress Notes (Signed)
Subjective:      Date: 01/08/2017 4:31 PM   Patient ID: Lori Arnold is a 66 y.o. female.    Chief Complaint:  Chief Complaint   Patient presents with   . Pre-op Exam       HPI:  Visit Type: Pre-operative Evaluation  Procedure: hernia  Date of Surgery: January 11, 2017  Surgeon  Tora [facility]  Fax Number (Required): [phone]  Chief Complaint:  hernia  Recent Health (admits): no current complaints  Recent Health (denies): fever, fatigue, chest pain, cough, nausea, vomiting, diarrhea, dyspnea, dysuria, urinary frequency, abdominal pain, easy bruising and LE swelling  Exercise Tolerance: 6 met ( i.e. shoveling snow )  Surgical Risk Factors: diabetes at goal  Prior Anesthesia: Patient reports no adverse reaction to anesthesia in the past.     Pt has appt with pulmonologist on 8/15, noted to have nodules on CT scan    Problem List:  Patient Active Problem List   Diagnosis   . Fibroids   . Prediabetes   . Lupus   . Hypothyroidism       Current Medications:  Current Outpatient Prescriptions   Medication Sig Dispense Refill   . buPROPion XL (WELLBUTRIN XL) 300 MG 24 hr tablet TAKE 1 TABLET BY MOUTH EVERY DAY IN THE MORNING 30 tablet 4   . cetirizine (ZYRTEC) 10 MG chewable tablet Chew 10 mg by mouth daily.     . fluticasone (FLONASE) 50 MCG/ACT nasal spray 1 spray by Nasal route as needed for Rhinitis.     . folic acid (FOLVITE) 1 MG tablet      . glucosamine-chondroitin 500-400 MG tablet Take 1 tablet by mouth 3 (three) times daily.     . hydroxychloroquine (PLAQUENIL) 200 MG tablet Take by mouth 2 (two) times daily.        Marland Kitchen levothyroxine (SYNTHROID, LEVOTHROID) 100 MCG tablet Take 1 tablet (100 mcg total) by mouth Once a day at 6:00am. 90 tablet 3   . liothyronine (CYTOMEL) 5 MCG tablet TAKE 1 TABLET (5 MCG TOTAL) BY MOUTH DAILY. 30 tablet 5   . metFORMIN (GLUCOPHAGE-XR) 500 MG 24 hr tablet TAKE 1 TABLET EVERY MORNING WITH BREAKFAST (Patient taking differently: TAKE 1 TABLET EVERY MORNING qhs) 30 tablet 3   .  Mirabegron ER (MYRBETRIQ) 50 MG Tablet SR 24 hr Take 50 mg by mouth daily. 30 tablet 5   . valacyclovir (VALTREX) 1000 MG tablet Take 1 tablet (1,000 mg total) by mouth 2 (two) times daily. 14 tablet 5   . venlafaxine (EFFEXOR-XR) 75 MG 24 hr capsule TAKE ONE CAPSULE BY MOUTH ONE TIME DAILY with food 30 capsule 3     No current facility-administered medications for this visit.        Allergies:  No Known Allergies    Past Medical History:  Past Medical History:   Diagnosis Date   . Anxiety    . Arthritis    . Depression    . Hypothyroidism     TAKES CYTOMEL & SYNTHROID   . Lupus     HAS ARTHRITIC TYPE SYMPTOMS   . Prediabetes        Past Surgical History:  Past Surgical History:   Procedure Laterality Date   . COLONOSCOPY  06/2013   . DE QUERVAIN'S RELEASE Bilateral 1988   . DILATION & CURRETAGE  1984    AFTER A STILLBORN   . JOINT REPLACEMENT      hip   .  ROBOT ASSISTED, SINGLE SITE, LAPAROSCOPIC, HYSTERECTOMY, TOTAL, BSO N/A 12/30/2014    Procedure: ROBOT ASSISTED, LAPAROSCOPIC, SINGLE SITE, HYSTERECTOMY, TOTAL BSO WITH CYSTOSCOPY;  Surgeon: Orson Aloe, MD;  Location: [facility];  Service: Gynecology;  Laterality: N/A;  ROBOT ASSISTED, LAPAROSCOPIC, SINGLE SITE, HYSTERECTOMY, TOTAL BSO WITH CYSTOSCOPY   . TONSILLECTOMY  1959   . TRIGGER FINGER RELEASE Bilateral [id number] & 2014       Family History:  Family History   Problem Relation Age of Onset   . Heart disease Mother    . Cancer Father    . Heart disease Sister    . Heart disease Brother        Social History:  Social History     Social History   . Marital status: Divorced     Spouse name: N/A   . Number of children: N/A   . Years of education: N/A     Occupational History   . Not on file.     Social History Main Topics   . Smoking status: Former Smoker     Packs/day: 1.00     Years: 25.00     Quit date: 12/09/1994   . Smokeless tobacco: Never Used   . Alcohol use 1.2 oz/week     2 Shots of liquor per week   . Drug use: No   . Sexual activity: No      Other Topics Concern   . Not on file     Social History Narrative   . No narrative on file       The following sections were reviewed this encounter by the provider:   Tobacco  Allergies  Meds  Problems  Med Hx  Surg Hx  Fam Hx  Soc Hx            Vitals:  BP 137/87 (BP Site: Left arm, Patient Position: Sitting, Cuff Size: Large)   Pulse 89   Temp 98.5 F (36.9 C) (Oral)   Resp 18   Ht 1.626 m (5\' 4" )   Wt 96.6 kg (213 lb)   SpO2 95%   BMI 36.56 kg/m     ROS:  General/Constitutional:           Denies Chills.  has Fatigue.  Denies Fever.       Ophthalmologic:           Denies Blurred vision.       ENT:           Denies Nasal Discharge.  Denies Sinus pain.  Denies Sore throat.       Endocrine:           Denies Polydipsia.  Denies Polyuria.       Respiratory:           Denies Cough.  Denies Orthopnea.  Denies Shortness of breath.  Denies Wheezing.       Cardiovascular:           Denies Chest pain.  Denies Chest pain with exertion.  Denies Palpitations.  Denies Swelling in hands/feet.       Gastrointestinal:           Denies Abdominal pain.  Denies Constipation.  Denies Diarrhea.  Denies Nausea.  Denies Vomiting.       Hematology:           has Easy bruising.  Denies Easy Bleeding.       Genitourinary:  Denies Blood in urine.  Denies Painful urination.       Peripheral Vascular:           Denies Pain/cramping in legs after exertion.  Denies Painful extremities.       Skin:          has bruised area right upper leg      Neurologic:           Denies Dizziness.  Denies Pre-Syncope.  Denies Tingling/Numbness.       Psychiatric:           has Anxiety.  has Depressed mood.            Objective:     Examination:   General Examination:  GENERAL APPEARANCE: alert, in no acute distress, well developed, well nourished, oriented to time, place, and person.   HEAD: normal appearance.   EYES: extraocular movement intact (EOMI), pupils equal, round, reactive to light and accommodation, sclera anicteric.    EARS: tympanic membranes normal bilaterally, external canals normal .   NOSE: normal nasal mucosa, nares patent.   ORAL CAVITY: normal oropharynx.   THROAT: no erythema, no exudate.   NECK/THYROID: neck supple, carotid pulse 2+ bilaterally, no lymphadenopathy, no thyromegaly.   SKIN: no rashes.   HEART: S1, S2 normal, no murmurs, rubs, gallops, regular rate and rhythm.   LUNGS: normal effort / no distress, normal breath sounds, clear to auscultation bilaterally, no wheezes, rales, rhonchi.   ABDOMEN: bowel sounds present, no hepatosplenomegaly, soft, nontender, nondistended.   EXTREMITIES: no clubbing, cyanosis, or edema.   PERIPHERAL PULSES: 2+ dorsalis pedis, 2+ posterior tibial.   NEUROLOGIC: nonfocal, cranial nerves 2-12 grossly intact, deep tendon reflexes 2+ symmetrical, normal strength, tone and reflexes, sensory exam intact.   PSYCH: alert, oriented, cognitive function intact.  EKG:NSR, no acute changes, normal EKG     Assessment/Plan:       1. Umbilical hernia without obstruction and without gangrene  - CBC and differential  - Comprehensive metabolic panel  - ECG 12 lead    2. Pre-op exam  - CBC and differential  - Comprehensive metabolic panel  - ECG 12 lead    There are no active cardiopulmonary symptoms. Diabetes is controlled on current medical regimen. Exercise tolerance is greater than 4 mets.  Recommend hold Metformin 48hrs prior to the surgical procedure. Pt is at intermediate risk for peri-operative cardiovascular complications during planned intermediate risk surgical procedure.      Beacher May, MD

## 2017-01-09 ENCOUNTER — Encounter (INDEPENDENT_AMBULATORY_CARE_PROVIDER_SITE_OTHER): Payer: Self-pay | Admitting: Internal Medicine

## 2017-01-09 ENCOUNTER — Other Ambulatory Visit (INDEPENDENT_AMBULATORY_CARE_PROVIDER_SITE_OTHER): Payer: Self-pay | Admitting: Internal Medicine

## 2017-01-09 DIAGNOSIS — D7589 Other specified diseases of blood and blood-forming organs: Secondary | ICD-10-CM

## 2017-01-09 LAB — COMPREHENSIVE METABOLIC PANEL
ALT: 23 U/L (ref 0–55)
AST (SGOT): 27 U/L (ref 5–34)
Albumin/Globulin Ratio: 1.3 (ref 0.9–2.2)
Albumin: 4.1 g/dL (ref 3.5–5.0)
Alkaline Phosphatase: 89 U/L (ref 37–106)
BUN: 22 mg/dL — ABNORMAL HIGH (ref 7.0–19.0)
Bilirubin, Total: 0.4 mg/dL (ref 0.1–1.2)
CO2: 28 mEq/L (ref 21–29)
Calcium: 10.2 mg/dL (ref 8.5–10.5)
Chloride: 105 mEq/L (ref 100–111)
Creatinine: 1 mg/dL (ref 0.4–1.5)
Globulin: 3.1 g/dL (ref 2.0–3.7)
Glucose: 97 mg/dL (ref 70–100)
Potassium: 4.5 mEq/L (ref 3.5–5.1)
Protein, Total: 7.2 g/dL (ref 6.0–8.3)
Sodium: 144 mEq/L (ref 136–145)

## 2017-01-09 LAB — HEMOLYSIS INDEX: Hemolysis Index: 7 (ref 0–18)

## 2017-01-09 LAB — GFR: EGFR: 55.7

## 2017-01-11 ENCOUNTER — Telehealth (INDEPENDENT_AMBULATORY_CARE_PROVIDER_SITE_OTHER): Payer: Self-pay | Admitting: Internal Medicine

## 2017-01-11 NOTE — Telephone Encounter (Signed)
Please send pt letter with my email message as the pt did not read the email.    Your red blood cell size is large. This will not affect your surgery but I would like for you to make a lab visit appointment in the next month to check some other labwork.

## 2017-01-15 ENCOUNTER — Telehealth (INDEPENDENT_AMBULATORY_CARE_PROVIDER_SITE_OTHER): Payer: Self-pay | Admitting: Family Medicine

## 2017-01-15 ENCOUNTER — Telehealth (INDEPENDENT_AMBULATORY_CARE_PROVIDER_SITE_OTHER): Payer: Self-pay | Admitting: Internal Medicine

## 2017-01-15 NOTE — Telephone Encounter (Signed)
Please call patient. I don't know what she is calling about.      Lori Arnold A 2 hours ago (3:25 PM)         Pt is needing some info about getting a CT scan of her throat. Please give pt a call [phone]         Documentation

## 2017-01-15 NOTE — Telephone Encounter (Signed)
Pt is needing some info about getting a CT scan of her throat. Please give pt a call [phone]

## 2017-01-16 ENCOUNTER — Encounter (INDEPENDENT_AMBULATORY_CARE_PROVIDER_SITE_OTHER): Payer: Self-pay | Admitting: Internal Medicine

## 2017-01-16 NOTE — Telephone Encounter (Signed)
I called patient the phone number is wrong

## 2017-01-17 ENCOUNTER — Encounter (INDEPENDENT_AMBULATORY_CARE_PROVIDER_SITE_OTHER): Payer: Self-pay | Admitting: Internal Medicine

## 2017-01-17 ENCOUNTER — Other Ambulatory Visit (INDEPENDENT_AMBULATORY_CARE_PROVIDER_SITE_OTHER): Payer: Self-pay | Admitting: Internal Medicine

## 2017-01-17 NOTE — Telephone Encounter (Signed)
Letter mailed out to patient.

## 2017-01-20 NOTE — Telephone Encounter (Signed)
Please mail a letter to the patient.  Also, she will need to call back with a correct telephone number.

## 2017-01-21 ENCOUNTER — Telehealth (INDEPENDENT_AMBULATORY_CARE_PROVIDER_SITE_OTHER): Payer: Self-pay | Admitting: Internal Medicine

## 2017-01-21 ENCOUNTER — Other Ambulatory Visit (INDEPENDENT_AMBULATORY_CARE_PROVIDER_SITE_OTHER): Payer: Self-pay | Admitting: Internal Medicine

## 2017-01-21 DIAGNOSIS — D7589 Other specified diseases of blood and blood-forming organs: Secondary | ICD-10-CM

## 2017-01-21 NOTE — Telephone Encounter (Signed)
Lab order for cbc, folate and B12 mailed to the patients home at [address] [facility] [address] [place], [place].

## 2017-01-23 DIAGNOSIS — R911 Solitary pulmonary nodule: Secondary | ICD-10-CM | POA: Insufficient documentation

## 2017-01-25 ENCOUNTER — Encounter (INDEPENDENT_AMBULATORY_CARE_PROVIDER_SITE_OTHER): Payer: Self-pay | Admitting: Internal Medicine

## 2017-01-30 ENCOUNTER — Encounter (INDEPENDENT_AMBULATORY_CARE_PROVIDER_SITE_OTHER): Payer: Self-pay | Admitting: Internal Medicine

## 2017-02-06 ENCOUNTER — Encounter (INDEPENDENT_AMBULATORY_CARE_PROVIDER_SITE_OTHER): Payer: Self-pay | Admitting: Internal Medicine

## 2017-02-07 ENCOUNTER — Other Ambulatory Visit (INDEPENDENT_AMBULATORY_CARE_PROVIDER_SITE_OTHER): Payer: Self-pay | Admitting: Internal Medicine

## 2017-02-15 ENCOUNTER — Encounter (INDEPENDENT_AMBULATORY_CARE_PROVIDER_SITE_OTHER): Payer: Self-pay | Admitting: Internal Medicine

## 2017-02-21 ENCOUNTER — Encounter (INDEPENDENT_AMBULATORY_CARE_PROVIDER_SITE_OTHER): Payer: Self-pay | Admitting: Internal Medicine

## 2017-02-21 DIAGNOSIS — D7589 Other specified diseases of blood and blood-forming organs: Secondary | ICD-10-CM

## 2017-02-25 ENCOUNTER — Encounter (INDEPENDENT_AMBULATORY_CARE_PROVIDER_SITE_OTHER): Payer: Self-pay | Admitting: Internal Medicine

## 2017-02-25 DIAGNOSIS — G4733 Obstructive sleep apnea (adult) (pediatric): Secondary | ICD-10-CM | POA: Insufficient documentation

## 2017-02-28 ENCOUNTER — Other Ambulatory Visit (INDEPENDENT_AMBULATORY_CARE_PROVIDER_SITE_OTHER): Payer: Self-pay | Admitting: Internal Medicine

## 2017-02-28 ENCOUNTER — Encounter (INDEPENDENT_AMBULATORY_CARE_PROVIDER_SITE_OTHER): Payer: Self-pay | Admitting: Internal Medicine

## 2017-02-28 DIAGNOSIS — D7589 Other specified diseases of blood and blood-forming organs: Secondary | ICD-10-CM

## 2017-03-19 ENCOUNTER — Encounter (INDEPENDENT_AMBULATORY_CARE_PROVIDER_SITE_OTHER): Payer: Self-pay | Admitting: Internal Medicine

## 2017-03-19 ENCOUNTER — Ambulatory Visit (INDEPENDENT_AMBULATORY_CARE_PROVIDER_SITE_OTHER): Payer: PRIVATE HEALTH INSURANCE | Admitting: Internal Medicine

## 2017-03-19 VITALS — BP 130/87 | HR 77 | Temp 98.4°F | Wt 215.0 lb

## 2017-03-19 DIAGNOSIS — M159 Polyosteoarthritis, unspecified: Secondary | ICD-10-CM | POA: Insufficient documentation

## 2017-03-19 DIAGNOSIS — E039 Hypothyroidism, unspecified: Secondary | ICD-10-CM

## 2017-03-19 DIAGNOSIS — R7303 Prediabetes: Secondary | ICD-10-CM

## 2017-03-19 DIAGNOSIS — R7989 Other specified abnormal findings of blood chemistry: Secondary | ICD-10-CM

## 2017-03-19 LAB — CBC AND DIFFERENTIAL
Absolute NRBC: 0 10*3/uL
Basophils Absolute Automated: 0.03 10*3/uL (ref 0.00–0.20)
Basophils Automated: 0.6 %
Eosinophils Absolute Automated: 0.15 10*3/uL (ref 0.00–0.70)
Eosinophils Automated: 3.2 %
Hematocrit: 41 % (ref 37.0–47.0)
Hgb: 12.9 g/dL (ref 12.0–16.0)
Immature Granulocytes Absolute: 0.01 10*3/uL
Immature Granulocytes: 0.2 %
Lymphocytes Absolute Automated: 1.3 10*3/uL (ref 0.50–4.40)
Lymphocytes Automated: 27.9 %
MCH: 33.5 pg — ABNORMAL HIGH (ref 28.0–32.0)
MCHC: 31.5 g/dL — ABNORMAL LOW (ref 32.0–36.0)
MCV: 106.5 fL — ABNORMAL HIGH (ref 80.0–100.0)
MPV: 9.7 fL (ref 9.4–12.3)
Monocytes Absolute Automated: 0.42 10*3/uL (ref 0.00–1.20)
Monocytes: 9 %
Neutrophils Absolute: 2.75 10*3/uL (ref 1.80–8.10)
Neutrophils: 59.1 %
Nucleated RBC: 0 /100 WBC (ref 0.0–1.0)
Platelets: 305 10*3/uL (ref 140–400)
RBC: 3.85 10*6/uL — ABNORMAL LOW (ref 4.20–5.40)
RDW: 15 % (ref 12–15)
WBC: 4.66 10*3/uL (ref 3.50–10.80)

## 2017-03-19 LAB — HEMOGLOBIN A1C
Average Estimated Glucose: 108.3 mg/dL
Hemoglobin A1C: 5.4 % (ref 4.6–5.9)

## 2017-03-19 LAB — TSH: TSH: 5.23 u[IU]/mL — ABNORMAL HIGH (ref 0.35–4.94)

## 2017-03-19 LAB — T4, FREE: T4 Free: 1.08 ng/dL (ref 0.70–1.48)

## 2017-03-19 LAB — T3, FREE: T3, Free: 2.76 pg/mL (ref 1.71–3.71)

## 2017-03-19 NOTE — Progress Notes (Signed)
1. Have you seen any specialists/other providers since your last visit with [place]?      Yes-pulmonologist      2. Limb alert protocol reviewed?    Yes - Patient denies any limb restrictions      3. The patient is due for mammogram, pap smear, depression screening, spirometry, influenza vaccine and shingles vaccine        Vivien Rota, LPN 2

## 2017-03-19 NOTE — Progress Notes (Signed)
Subjective:      Date: 03/19/2017 8:35 AM   Patient ID: Lori Arnold is a 66 y.o. female.    Chief Complaint:  Chief Complaint   Patient presents with   . Follow-up     Dr. Nedra Hai advised pt to see Hematology due to "large RBC". Pt has not set up consult yet, may need referral.   . Hypothyroidism     Pt states she is due for thyroid check       HPI:  HPI   Pt has hypothyroidism - compliant with current regimen.  Denies side effects to therapy.  Denies any change in hair/nails/skin, constipation, unexplained wt changes.    Pt has had mild anemia over the past several years.  Denies change in energy level.  MCV averages - 90's.  Pt had one prior CBC (12/2016) - MCV 107.  B12 and folate WNL.     Pt has prediabetes - pt is modifying diet. Pt is starting to exercise again. Denies polyuria, polydipsia, polyphagia.  Pt I compliant with Metformin.     Problem List:  Patient Active Problem List   Diagnosis   . Fibroids   . Prediabetes   . Lupus   . Hypothyroidism   . Generalized osteoarthritis   . Obstructive sleep apnea syndrome   . Solitary pulmonary nodule       Current Medications:  Current Outpatient Prescriptions   Medication Sig Dispense Refill   . acetaZOLAMIDE (DIAMOX) 250 MG tablet acetazolamide 250 mg tablet     . albuterol (PROAIR HFA) 108 (90 Base) MCG/ACT inhaler as needed.     Marland Kitchen azaTHIOprine (IMURAN) 50 MG tablet azathioprine 50 mg tablet     . buPROPion XL (WELLBUTRIN XL) 300 MG 24 hr tablet TAKE 1 TABLET BY MOUTH EVERY DAY IN THE MORNING 30 tablet 5   . cetirizine (ZYRTEC) 10 MG chewable tablet Chew 10 mg by mouth daily.     . Diclofenac Sodium CR 100 MG 24 hr tablet diclofenac ER 100 mg tablet,extended release 24 hr     . EPINEPHrine (EPIPEN 2-PAK) 0.3 MG/0.3ML Solution Auto-injector injection EpiPen 2-Pak 0.3 mg/0.3 mL injection, auto-injector     . Fesoterodine Fumarate (TOVIAZ) 8 MG Tablet SR 24 hr Toviaz 8 mg tablet,extended release     . fluticasone furoate-vilanterol (BREO ELLIPTA) 100-25 MCG/INH  Aerosol Powder, Breath Activtivatede Breo Ellipta 100 mcg-25 mcg/dose powder for inhalation   Inhale 1 puff every day by inhalation route for 30 days.     . folic acid (FOLVITE) 1 MG tablet      . glucosamine-chondroitin 500-400 MG tablet Take 1 tablet by mouth 3 (three) times daily.     . hydroxychloroquine (PLAQUENIL) 200 MG tablet Take by mouth 2 (two) times daily.        . hydroxychloroquine (PLAQUENIL) 200 MG tablet hydroxychloroquine 200 mg tablet   Take 1 tablet every day by oral route.     Marland Kitchen levothyroxine (SYNTHROID, LEVOTHROID) 100 MCG tablet Take 1 tablet (100 mcg total) by mouth Once a day at 6:00am. 90 tablet 3   . liothyronine (CYTOMEL) 5 MCG tablet TAKE 1 TABLET (5 MCG TOTAL) BY MOUTH DAILY. 30 tablet 5   . metFORMIN (GLUCOPHAGE-XR) 500 MG 24 hr tablet TAKE 1 TABLET EVERY MORNING WITH BREAKFAST (Patient taking differently: TAKE 1 TABLET EVERY MORNING qhs) 30 tablet 3   . methotrexate 2.5 MG tablet methotrexate sodium 2.5 mg tablet   TAKE 7 TABS BY MOUTH EVERY WEEK IN  THE EVENING     . Mirabegron ER (MYRBETRIQ) 50 MG Tablet SR 24 hr Take 50 mg by mouth daily. 30 tablet 5   . valacyclovir (VALTREX) 1000 MG tablet Take 1 tablet (1,000 mg total) by mouth 2 (two) times daily. 14 tablet 5   . venlafaxine (EFFEXOR-XR) 75 MG 24 hr capsule TAKE ONE CAPSULE BY MOUTH ONE TIME DAILY with food 30 capsule 3   . zolpidem (AMBIEN) 5 MG tablet TAKE ONE TABLET BY MOUTH AT BEDTIME AS NEEDED FOR SLEEP 15 tablet 1     No current facility-administered medications for this visit.        Allergies:  No Known Allergies    Past Medical History:  Past Medical History:   Diagnosis Date   . Anxiety    . Arthritis    . Depression    . Hypothyroidism     TAKES CYTOMEL & SYNTHROID   . Lupus     HAS ARTHRITIC TYPE SYMPTOMS   . Prediabetes        Past Surgical History:  Past Surgical History:   Procedure Laterality Date   . COLONOSCOPY  06/2013   . DE QUERVAIN'S RELEASE Bilateral 1988   . DILATION & CURRETAGE  1984    AFTER A STILLBORN    . JOINT REPLACEMENT      hip   . ROBOT ASSISTED, SINGLE SITE, LAPAROSCOPIC, HYSTERECTOMY, TOTAL, BSO N/A 12/30/2014    Procedure: ROBOT ASSISTED, LAPAROSCOPIC, SINGLE SITE, HYSTERECTOMY, TOTAL BSO WITH CYSTOSCOPY;  Surgeon: Orson Aloe, MD;  Location: [facility];  Service: Gynecology;  Laterality: N/A;  ROBOT ASSISTED, LAPAROSCOPIC, SINGLE SITE, HYSTERECTOMY, TOTAL BSO WITH CYSTOSCOPY   . TONSILLECTOMY  1959   . TRIGGER FINGER RELEASE Bilateral 16109 & 2014       Family History:  Family History   Problem Relation Age of Onset   . Heart disease Mother    . Cancer Father    . Heart disease Sister    . Heart disease Brother        Social History:  Social History     Social History   . Marital status: Divorced     Spouse name: N/A   . Number of children: N/A   . Years of education: N/A     Occupational History   . Not on file.     Social History Main Topics   . Smoking status: Former Smoker     Packs/day: 1.00     Years: 25.00     Quit date: 12/09/1994   . Smokeless tobacco: Never Used   . Alcohol use 1.2 oz/week     2 Shots of liquor per week   . Drug use: No   . Sexual activity: No     Other Topics Concern   . Not on file     Social History Narrative   . No narrative on file       The following sections were reviewed this encounter by the provider:   Tobacco  Allergies  Meds  Problems  Med Hx  Surg Hx  Fam Hx  Soc Hx          Vitals:  BP 130/87   Pulse 77   Temp 98.4 F (36.9 C) (Oral)   Wt 97.5 kg (215 lb)   SpO2 96%   BMI 36.90 kg/m    ROS:  General/Constitutional:   Denies Chills. Denies Fatigue. Denies Fever.   Ophthalmologic:   Denies Blurred  vision.   ENT:   Denies Nasal Discharge. Denies Sinus pain. Denies Sore throat.   Respiratory:   Denies Cough. Denies Shortness of breath. Denies Wheezing.   Cardiovascular:   Denies Chest pain. Denies Chest pain with exertion. Denies Palpitations. Denies  Swelling in hands/feet.   Gastrointestinal:   Denies Abdominal pain. Denies Constipation.  Denies Diarrhea. Denies Nausea. Denies  Vomiting.   Skin:   Denies Rash.   Neurologic:   Denies Dizziness. Denies Headache. Denies Tingling/Numbness.        Objective:       Physical Exam:  General Examination:   GENERAL APPEARANCE: alert, in no acute distress, well developed, well nourished  oriented to time, place, and person.   ORAL CAVITY: normal oropharynx, normal lips, mucosa moist.   NECK/THYROID: neck supple, no carotid bruit, carotid pulse 2+ bilaterally, nono neck mass palpated, no jugular venous distention, no thyromegaly.  LYMPH: no cervical/supraclavicular adenopathy   HEART: S1, S2 normal, no murmurs, rubs, gallops, regular rate and rhythm.   LUNGS: normal effort / no distress, normal breath sounds, clear to auscultation  bilaterally, no wheezes, rales, rhonchi.   EXTREMITIES: No LE edema bilaterally.   PERIPHERAL PULSES: 2+ dorsalis pedis, 2+ posterior tibial bilaterally.   PSYCH: alert and oriented to time,place and person.   SKIN: moist, no focal rash     Assessment:       1. Acquired hypothyroidism  - TSH  - T4, free  - T3, free    2. Prediabetes  - Hemoglobin A1C    3. Abnormal CBC  - CBC and differential        Plan:     Prediabetes:  Lab Results   Component Value Date    HGBA1C 5.7 08/23/2016    HGBA1C 5.4 03/23/2016    HGBA1C 5.7 (H) 07/18/2015     Prior A1C elevated at prediabetic levels.  Continue to optimize low carbohydrate diet and aerobic exercise measures.  Obtain A1C today.    Hypothyroidism:  Lab Results   Component Value Date    TSH 4.25 08/23/2016    FREET3 2.78 08/23/2016    T4FREE 1.05 08/23/2016     Pt is clinically and chemically euthyroid.  Continue current thyroid replacement medication regimen. Obtain TFT's today.    Elevated MCV:  Prior MCV's usually in 90's.  Recommend repeat CBC to monitor trend.  Prior B12, folate and diff WNL.       Sandford Craze, MD

## 2017-03-20 ENCOUNTER — Other Ambulatory Visit (INDEPENDENT_AMBULATORY_CARE_PROVIDER_SITE_OTHER): Payer: Self-pay | Admitting: Internal Medicine

## 2017-03-20 DIAGNOSIS — R7989 Other specified abnormal findings of blood chemistry: Secondary | ICD-10-CM

## 2017-03-20 DIAGNOSIS — E039 Hypothyroidism, unspecified: Secondary | ICD-10-CM

## 2017-03-20 MED ORDER — MIRABEGRON ER 50 MG PO TB24
50.0000 mg | ORAL_TABLET | Freq: Every day | ORAL | 5 refills | Status: DC
Start: 2017-03-20 — End: 2017-10-22

## 2017-03-20 NOTE — Telephone Encounter (Signed)
Scripts signed and sent to pharmacy on file

## 2017-03-21 ENCOUNTER — Other Ambulatory Visit (INDEPENDENT_AMBULATORY_CARE_PROVIDER_SITE_OTHER): Payer: Self-pay | Admitting: Internal Medicine

## 2017-03-21 DIAGNOSIS — E039 Hypothyroidism, unspecified: Secondary | ICD-10-CM

## 2017-03-21 MED ORDER — SYNTHROID 112 MCG PO TABS
112.0000 ug | ORAL_TABLET | Freq: Every day | ORAL | 3 refills | Status: DC
Start: 2017-03-21 — End: 2017-12-12

## 2017-03-22 ENCOUNTER — Other Ambulatory Visit: Payer: Self-pay

## 2017-03-22 ENCOUNTER — Other Ambulatory Visit (INDEPENDENT_AMBULATORY_CARE_PROVIDER_SITE_OTHER): Payer: Self-pay | Admitting: Internal Medicine

## 2017-03-22 DIAGNOSIS — E039 Hypothyroidism, unspecified: Secondary | ICD-10-CM

## 2017-03-22 DIAGNOSIS — R7989 Other specified abnormal findings of blood chemistry: Secondary | ICD-10-CM

## 2017-03-22 NOTE — Progress Notes (Signed)
Vivien Rota, LPN  Sandford Craze, MD            Discussed lab results and MD recommendations with pt by phone. Pharmacy verified. Please send Rx to [facility] pharmacy on file. Also, pt would like to have her labs drawn at [facility], please change resulting agency from [facility] to [facility]. Orders will then be mailed to pt, per pt request. Vivien Rota, LPN 2      Orders placed in Epic as requested  Printed and placed in triage for mailing

## 2017-03-26 ENCOUNTER — Other Ambulatory Visit (INDEPENDENT_AMBULATORY_CARE_PROVIDER_SITE_OTHER): Payer: Self-pay | Admitting: Internal Medicine

## 2017-03-31 ENCOUNTER — Other Ambulatory Visit (INDEPENDENT_AMBULATORY_CARE_PROVIDER_SITE_OTHER): Payer: Self-pay | Admitting: Internal Medicine

## 2017-04-22 ENCOUNTER — Encounter (INDEPENDENT_AMBULATORY_CARE_PROVIDER_SITE_OTHER): Payer: Self-pay | Admitting: Internal Medicine

## 2017-05-28 ENCOUNTER — Other Ambulatory Visit (INDEPENDENT_AMBULATORY_CARE_PROVIDER_SITE_OTHER): Payer: Self-pay | Admitting: Internal Medicine

## 2017-07-10 ENCOUNTER — Other Ambulatory Visit (INDEPENDENT_AMBULATORY_CARE_PROVIDER_SITE_OTHER): Payer: Self-pay | Admitting: Internal Medicine

## 2017-07-11 ENCOUNTER — Encounter (INDEPENDENT_AMBULATORY_CARE_PROVIDER_SITE_OTHER): Payer: Self-pay | Admitting: Internal Medicine

## 2017-07-11 LAB — CBC AND DIFFERENTIAL
Baso(Absolute): 0 10*3/uL (ref 0.0–0.2)
Basos: 0 %
Eos: 1 %
Eosinophils Absolute: 0.1 10*3/uL (ref 0.0–0.4)
Hematocrit: 39.1 % (ref 34.0–46.6)
Hemoglobin: 13.1 g/dL (ref 11.1–15.9)
Immature Granulocytes Absolute: 0 10*3/uL (ref 0.0–0.1)
Immature Granulocytes: 0 %
Lymphocytes Absolute: 1.6 10*3/uL (ref 0.7–3.1)
Lymphocytes: 18 %
MCH: 32.8 pg (ref 26.6–33.0)
MCHC: 33.5 g/dL (ref 31.5–35.7)
MCV: 98 fL — ABNORMAL HIGH (ref 79–97)
Monocytes Absolute: 0.7 10*3/uL (ref 0.1–0.9)
Monocytes: 8 %
Neutrophils Absolute: 6.4 10*3/uL (ref 1.4–7.0)
Neutrophils: 73 %
Platelets: 350 10*3/uL (ref 150–379)
RBC: 4 x10E6/uL (ref 3.77–5.28)
RDW: 14.5 % (ref 12.3–15.4)
WBC: 8.7 10*3/uL (ref 3.4–10.8)

## 2017-07-11 LAB — TSH: TSH: 2.7 u[IU]/mL (ref 0.450–4.500)

## 2017-07-11 LAB — T3, FREE: T3, Free: 3 pg/mL (ref 2.0–4.4)

## 2017-07-11 LAB — T4, FREE: T4, Free: 1.46 ng/dL (ref 0.82–1.77)

## 2017-08-08 ENCOUNTER — Other Ambulatory Visit (INDEPENDENT_AMBULATORY_CARE_PROVIDER_SITE_OTHER): Payer: Self-pay | Admitting: Internal Medicine

## 2017-08-27 ENCOUNTER — Emergency Department: Payer: 59

## 2017-08-27 ENCOUNTER — Emergency Department
Admission: EM | Admit: 2017-08-27 | Discharge: 2017-08-27 | Disposition: A | Discharge status: Home / Self Care | Payer: 59 | Attending: Emergency Medicine | Admitting: Emergency Medicine

## 2017-08-27 DIAGNOSIS — Z7984 Long term (current) use of oral hypoglycemic drugs: Secondary | ICD-10-CM | POA: Insufficient documentation

## 2017-08-27 DIAGNOSIS — W1849XA Other slipping, tripping and stumbling without falling, initial encounter: Secondary | ICD-10-CM | POA: Insufficient documentation

## 2017-08-27 DIAGNOSIS — R7303 Prediabetes: Secondary | ICD-10-CM | POA: Insufficient documentation

## 2017-08-27 DIAGNOSIS — S92354A Nondisplaced fracture of fifth metatarsal bone, right foot, initial encounter for closed fracture: Secondary | ICD-10-CM | POA: Insufficient documentation

## 2017-08-27 DIAGNOSIS — R030 Elevated blood-pressure reading, without diagnosis of hypertension: Secondary | ICD-10-CM | POA: Insufficient documentation

## 2017-08-27 DIAGNOSIS — E039 Hypothyroidism, unspecified: Secondary | ICD-10-CM | POA: Insufficient documentation

## 2017-08-27 DIAGNOSIS — Z79899 Other long term (current) drug therapy: Secondary | ICD-10-CM | POA: Insufficient documentation

## 2017-08-27 NOTE — ED Triage Notes (Signed)
Right foot injury after tripping on a step, onset around noon today.  Able to bear weight on foot but very painful.  Pain and bruising noted to lateral side of foot.  No complaints related to ankle area.  No deformity noted.  Denies weapons.

## 2017-08-27 NOTE — Discharge Instructions (Signed)
YOU MAY SEE THE DOCTOR LISTED IF YOUR DOCTOR IS NOT AVAILABLE FOR FOLLOW UP IN THE TIME FRAME PROVIDED.  RETURN TO THE EMERGENCY DEPARTMENT IMMEDIATELY FOR ANY WORSENING OR CONCERNS.      Elevate the injured part above your heart as often as possible and Apply cold packs to area for 20 minutes of every 2 hours for the first 48 hours after injury.      Foot Fracture    You have a fracture in your foot.    The foot is made up of 5 tarsal and 5 metatarsal bones.    A fracture is a break in a bone. It means the same thing as saying a "broken bone." In general fractures heal over about 6-8 weeks. The place that is broken will eventually become stronger than the area around it. At first, fractures are often treated with a splint. The splint keeps the injured area immobilized (still). However, the orthopedic (bone) doctor will probably exchange it for a cast. Most fractures can be managed with a splint or cast. Some need surgery for the best alignment and fracture correction. The orthopedic doctor will help decide this.    Do not put any weight on the injured foot. Use crutches to help get around.    Generally, fracture treatment includes the use of pain medicine and a splint/cast to reduce movement. Treatment also involves Resting, Icing, Compressing and Elevating the injured area. Remember this as "RICE."   REST: Limit the use of the injured body part.   ICE: By applying ice to the affected area, swelling and pain can be reduced. Place some ice cubes in a re-sealable (Ziploc) bag and add some water. Put a thin washcloth between the bag and the skin. Apply the ice bag to the area for at least 20 minutes. Do this at least 4 times per day. Using the ice for longer times and more frequently is OK. NEVER APPLY ICE DIRECTLY TO THE SKIN.   COMPRESS: Compression means to apply pressure around the injured area such as with a splint, cast or an ACE bandage. Compression decreases swelling and improves comfort. Compression  should be tight enough to relieve swelling but not so tight as to decrease circulation. Increasing pain, numbness, tingling, or change in skin color, are all signs of decreased circulation.   ELEVATE: Elevate the injured part. For example, a leg can be elevated by placing the leg on a chair while sitting or by propping it up on pillows while lying down.    You were given a SPLINT for your fracture. This is to lower pain. It also helps keep the injured area immobilized (stable). Use the splint until you follow up with the referral orthopedic (bone) doctor.    Use the following SPLINT CARE instructions. Do the following many times throughout the day:   Check capillary refill (circulation) in the nail beds. Press on the nail bed and let go. The nail bed should turn white. It should then go pink again in less than 2 seconds.   Watch to see if the area beyond the splint gets swollen.   Sometimes the splint is too tight. When this happens, the skin of the hand, foot, fingers or toes is very cold, pale or numb to the touch. The wrap holding the splint in place can be loosened or you can come back here or go to the nearest Emergency Department to have it adjusted.    YOU SHOULD SEEK MEDICAL ATTENTION IMMEDIATELY, EITHER  HERE OR AT THE NEAREST EMERGENCY DEPARTMENT, IF ANY OF THE FOLLOWING OCCURS:   You have a severe increase in pain or swelling in the injured area.   You have new numbness or tingling in or below the injured area.   Your foot gets cold and pale. This could mean that the foot has a problem with its blood supply.          Elevated Blood Pressure    During your visit today your blood pressure was higher than normal.    Check your blood pressure several times over the next several days, then follow up with your regular doctor. If you do not have a doctor, ask the medical staff to refer you to one.    You may need medication for your blood pressure if it stays high. Untreated high blood pressure can  cause damage to your heart and kidneys and may lead to a heart attack or stroke. It is VERY IMPORTANT to follow up with your doctor.   Check your blood pressure daily and follow up with your doctor.   A doctor will diagnose high blood pressure only if your blood pressure is high for several days. Many pharmacies have machines that let you check your own blood pressure. You can also check with a fire station to see whether a paramedic will take your blood pressure. Another option is to purchase a blood pressure monitor to use at home. These are available at most pharmacies.     YOU SHOULD SEEK MEDICAL ATTENTION IMMEDIATELY, EITHER HERE OR AT THE NEAREST EMERGENCY DEPARTMENT, IF ANY OF THE FOLLOWING OCCURS:   You have a sudden or severe headache.   You are numb, tingly, or weak on one side of your body, half of your face droops, or you have trouble speaking.   You have chest pain.   You are short of breath.

## 2017-08-28 ENCOUNTER — Telehealth (INDEPENDENT_AMBULATORY_CARE_PROVIDER_SITE_OTHER): Payer: Self-pay

## 2017-08-28 NOTE — Telephone Encounter (Signed)
ED Visit Follow Up Call    ED DISCHARGE CALL:     ED Discharge Date: 08/27/2017   Primary Discharge JY:NWGNFA nondisplaced fracture of fifth metatarsal bone of right foot, initial encounter  Secondary OZ:HYQMVHQI blood pressure reading  ED Discharge Appt with PCP: None scheduled    Call placed to patient to follow up on recent ED visit, assess current status, address questions/concerns regarding discharge instructions / medications and encourage patient to schedule ED Discharge follow up appt with PCP; No Answer; LVMM with contact information and request for return call.  Will continue to follow patient.

## 2017-09-02 ENCOUNTER — Other Ambulatory Visit (INDEPENDENT_AMBULATORY_CARE_PROVIDER_SITE_OTHER): Payer: Self-pay | Admitting: Internal Medicine

## 2017-09-03 NOTE — ED Provider Notes (Signed)
Physician/Midlevel provider first contact with patient: 08/27/17 1917         History     Chief Complaint   Patient presents with   . Foot Injury     HPI   Right foot injury after Having tripped on a step at noon today.   painful with weightbearing.   No loss of consciousness, no alcohol or drug use, no head, neck, back, chest, abdomen pain or injury.  No numbness, tingling or weakness.  No speech or vision trouble.  Pt answers that there are no fevers, chills, nausea, vomiting, diarrhea, constipation, trauma or rash.        Past Medical History:   Diagnosis Date   . Anxiety    . Arthritis    . Depression    . Hypothyroidism     TAKES CYTOMEL & SYNTHROID   . Lupus     HAS ARTHRITIC TYPE SYMPTOMS   . Prediabetes        Past Surgical History:   Procedure Laterality Date   . COLONOSCOPY  06/2013   . DE QUERVAIN'S RELEASE Bilateral 1988   . DILATION & CURRETAGE  1984    AFTER A STILLBORN   . JOINT REPLACEMENT      hip   . ROBOT ASSISTED, SINGLE SITE, LAPAROSCOPIC, HYSTERECTOMY, TOTAL, BSO N/A 12/30/2014    Procedure: ROBOT ASSISTED, LAPAROSCOPIC, SINGLE SITE, HYSTERECTOMY, TOTAL BSO WITH CYSTOSCOPY;  Surgeon: Orson Aloe, MD;  Location: [facility];  Service: Gynecology;  Laterality: N/A;  ROBOT ASSISTED, LAPAROSCOPIC, SINGLE SITE, HYSTERECTOMY, TOTAL BSO WITH CYSTOSCOPY   . TONSILLECTOMY  1959   . TRIGGER FINGER RELEASE Bilateral [id number] & 2014       Family History   Problem Relation Age of Onset   . Heart disease Mother    . Cancer Father    . Heart disease Sister    . Heart disease Brother        Social  Social History   Substance Use Topics   . Smoking status: Former Smoker     Packs/day: 1.00     Years: 25.00     Quit date: 12/09/1994   . Smokeless tobacco: Never Used   . Alcohol use 1.2 oz/week     2 Shots of liquor per week       .     No Known Allergies    Home Medications     Med List Status:  In Progress Set By: Lennon Alstrom, RN at 08/27/2017  6:14 PM                acetaZOLAMIDE (DIAMOX) 250  MG tablet     acetazolamide 250 mg tablet     albuterol (PROAIR HFA) 108 (90 Base) MCG/ACT inhaler     as needed.     azaTHIOprine (IMURAN) 50 MG tablet     azathioprine 50 mg tablet     cetirizine (ZYRTEC) 10 MG chewable tablet     Chew 10 mg by mouth daily.     Diclofenac Sodium CR 100 MG 24 hr tablet     diclofenac ER 100 mg tablet,extended release 24 hr     EPINEPHrine (EPIPEN 2-PAK) 0.3 MG/0.3ML Solution Auto-injector injection     EpiPen 2-Pak 0.3 mg/0.3 mL injection, auto-injector     Fesoterodine Fumarate (TOVIAZ) 8 MG Tablet SR 24 hr     Toviaz 8 mg tablet,extended release     fluticasone furoate-vilanterol (BREO ELLIPTA) 100-25 MCG/INH Aerosol Powder, Breath  Activtivatede     Breo Ellipta 100 mcg-25 mcg/dose powder for inhalation   Inhale 1 puff every day by inhalation route for 30 days.     folic acid (FOLVITE) 1 MG tablet          glucosamine-chondroitin 500-400 MG tablet     Take 1 tablet by mouth 3 (three) times daily.     hydroxychloroquine (PLAQUENIL) 200 MG tablet     Take by mouth 2 (two) times daily.        hydroxychloroquine (PLAQUENIL) 200 MG tablet     hydroxychloroquine 200 mg tablet   Take 1 tablet every day by oral route.     liothyronine (CYTOMEL) 5 MCG tablet     TAKE ONE TABLET BY MOUTH ONE TIME DAILY      metFORMIN (GLUCOPHAGE-XR) 500 MG 24 hr tablet     TAKE ONE TABLET BY MOUTH IN THE MORNING with breakfast     methotrexate 2.5 MG tablet     methotrexate sodium 2.5 mg tablet   TAKE 7 TABS BY MOUTH EVERY WEEK IN THE EVENING     Mirabegron ER (MYRBETRIQ) 50 MG Tablet SR 24 hr     Take 50 mg by mouth daily.     SYNTHROID 112 MCG tablet     Take 1 tablet (112 mcg total) by mouth daily.     valacyclovir (VALTREX) 1000 MG tablet     TAKE ONE TABLET BY MOUTH TWICE DAILY      venlafaxine (EFFEXOR-XR) 75 MG 24 hr capsule     TAKE ONE CAPSULE BY MOUTH ONE TIME DAILY WITH FOOD     zolpidem (AMBIEN) 5 MG tablet     TAKE ONE TABLET BY MOUTH AT BEDTIME AS NEEDED FOR SLEEP                     Review  of Systems  Pertinent positives:  Foot injury for pain      Eyes:  Denies eye pain   Cardiovascular:  Denies chest pain or edema   GI:  Denies abdominal pain   Musculoskeletal:  Denies other joint pain  Integument:  Denies rash  or wound  Neurologic:  Denies headache, focal weakness, numbness, tingling   Psychiatric:  Denies suicidal ideation.      Physical Exam    BP: 165/89, Heart Rate: 81, Temp: 98 F (36.7 C), Resp Rate: 16, SpO2: 96 %, Weight: 90.7 kg    Physical Exam  Pertinent Positives:  Lateral tenderness and ecchymosis to foot.  Neurovascular intact otherwise      Constitutional:  Well developed, well nourished, no acute distress, not ill appearing   Eyes:   conjunctiva normal, no discharge or redness  HENT:  Atraumatic, external ears normal, nose normal, oropharynx moist, no pharyngeal exudates. Neck- normal range of motion, no tenderness, supple   Respiratory:  No respiratory distress, normal breath sounds, no rales, no wheezing, no rhonchi  Cardiovascular:  Normal rate, normal rhythm, no murmurs, no gallops, no rubs, normal radial pulses and dpp   GI:  Soft, nondistended, nontender, no organomegaly, no mass, no rebound, no guarding   GU:  No costovertebral angle tenderness   Musculoskeletal: no deformities. Back- no tenderness  Integument:  Well hydrated, no rash   Lymphatic:  No prominent cervical LAD  Neurologic:  Alert & oriented x 3, normal motor function, no focal deficits noted, coordination normal   Psychiatric:  Speech and behavior appropriate  Diagnosis management comments: I, Ferdinand Cava, MD, have been the primary provider for this patient during this Emergency Dept visit.    Oxygen saturation by pulse oximetry is 96%, Normal, none needed.    DDx:   Fracture versus sprain versus contusion versus other        Patient Progress  Patient progress: Stable      MDM and ED Course     ED Medication Orders     None             MDM                 Procedures   Radiology Results (24 Hour)      Procedure Component Value Units Date/Time    Foot Right AP Lateral And Oblique [[id number]] Collected:  08/27/17 1925    Order Status:  Completed Updated:  08/27/17 1930    Narrative:       HISTORY: Tripped on steps    COMPARISON: 01/25/2013    FINDINGS:   Fracture of the fifth proximal phalanx has healed. Bones appear slightly  demineralized. Joint space narrowing at the first MTP joint.  Nondisplaced fracture at the base of the fifth metatarsal. No  dislocation.      Impression:          Nondisplaced fracture of the base of the fifth metatarsal.    Shelly Flatten, MD   08/27/2017 7:26 PM          Clinical Impression & Disposition     Clinical Impression  Final diagnoses:   Closed nondisplaced fracture of fifth metatarsal bone of right foot, initial encounter   Elevated blood pressure reading        ED Disposition     ED Disposition Condition Date/Time Comment    Discharge  Tue Aug 27, 2017  7:47 PM [facility] discharge to home/self care.    Condition at disposition: Stable           Discharge Medication List as of 08/27/2017  7:48 PM                    Ferdinand Cava, MD  09/03/17 [phone]

## 2017-09-11 ENCOUNTER — Other Ambulatory Visit (INDEPENDENT_AMBULATORY_CARE_PROVIDER_SITE_OTHER): Payer: Self-pay | Admitting: Internal Medicine

## 2017-10-22 ENCOUNTER — Other Ambulatory Visit (INDEPENDENT_AMBULATORY_CARE_PROVIDER_SITE_OTHER): Payer: Self-pay | Admitting: Internal Medicine

## 2017-10-28 ENCOUNTER — Encounter (INDEPENDENT_AMBULATORY_CARE_PROVIDER_SITE_OTHER): Payer: Self-pay | Admitting: Internal Medicine

## 2017-12-11 ENCOUNTER — Encounter (INDEPENDENT_AMBULATORY_CARE_PROVIDER_SITE_OTHER): Payer: Self-pay | Admitting: Internal Medicine

## 2017-12-12 ENCOUNTER — Other Ambulatory Visit (INDEPENDENT_AMBULATORY_CARE_PROVIDER_SITE_OTHER): Payer: Self-pay | Admitting: Internal Medicine

## 2017-12-12 DIAGNOSIS — E039 Hypothyroidism, unspecified: Secondary | ICD-10-CM

## 2017-12-12 MED ORDER — SYNTHROID 112 MCG PO TABS
112.00 ug | ORAL_TABLET | Freq: Every day | ORAL | 3 refills | Status: AC
Start: 2017-12-12 — End: ?

## 2017-12-12 MED ORDER — BUPROPION HCL ER (XL) 300 MG PO TB24
ORAL_TABLET | ORAL | 3 refills | Status: AC
Start: 2017-12-12 — End: ?

## 2017-12-12 MED ORDER — VENLAFAXINE HCL ER 75 MG PO CP24
75.00 mg | ORAL_CAPSULE | Freq: Every day | ORAL | 3 refills | Status: AC
Start: 2017-12-12 — End: ?

## 2017-12-12 MED ORDER — LIOTHYRONINE SODIUM 5 MCG PO TABS
5.00 ug | ORAL_TABLET | Freq: Every day | ORAL | 3 refills | Status: AC
Start: 2017-12-12 — End: ?

## 2017-12-12 MED ORDER — METFORMIN HCL ER 500 MG PO TB24
ORAL_TABLET | ORAL | 3 refills | Status: AC
Start: 2017-12-12 — End: ?

## 2017-12-12 MED ORDER — MIRABEGRON ER 50 MG PO TB24
1.00 | ORAL_TABLET | Freq: Every day | ORAL | 3 refills | Status: DC
Start: 2017-12-12 — End: 2018-05-26

## 2018-05-06 ENCOUNTER — Telehealth (INDEPENDENT_AMBULATORY_CARE_PROVIDER_SITE_OTHER): Payer: Self-pay | Admitting: Internal Medicine

## 2018-05-06 NOTE — Telephone Encounter (Signed)
Please inform [facility] that the PCP is not prescribing this medication.  The representative should contact the Rheumatologist who prescribed the medication

## 2018-05-06 NOTE — Telephone Encounter (Signed)
[facility] Representative called to let you know that   RX reference # [id number]  methotrexate 2.5 MG tablet  Is not covered by her insurance-   Patient last OV 0710/2018  Please call before 2 PM today otherwise RX request refill will be canceled.  REP stated that a fax was sent with alternate RX names substitutions.  Please call [phone]

## 2018-05-26 ENCOUNTER — Encounter (INDEPENDENT_AMBULATORY_CARE_PROVIDER_SITE_OTHER): Payer: Self-pay | Admitting: Internal Medicine

## 2018-05-26 ENCOUNTER — Other Ambulatory Visit (INDEPENDENT_AMBULATORY_CARE_PROVIDER_SITE_OTHER): Payer: Self-pay

## 2018-05-26 MED ORDER — MIRABEGRON ER 50 MG PO TB24
1.00 | ORAL_TABLET | Freq: Every day | ORAL | 3 refills | Status: AC
Start: 2018-05-26 — End: ?

## 2018-05-26 NOTE — Telephone Encounter (Signed)
Last filled July 2019.   Last o/v October 2018.  Pt does not have an upcoming appointment. .  Queued up 30 day supply with 0 refills.

## 2018-06-19 NOTE — Telephone Encounter (Signed)
Called [facility] to let them know that they need to call the patient  Rheumatologist.

## 2018-07-24 ENCOUNTER — Encounter (INDEPENDENT_AMBULATORY_CARE_PROVIDER_SITE_OTHER): Payer: Self-pay | Admitting: Internal Medicine

## 2019-08-14 ENCOUNTER — Other Ambulatory Visit (INDEPENDENT_AMBULATORY_CARE_PROVIDER_SITE_OTHER): Payer: Self-pay | Admitting: Internal Medicine

## 2020-08-02 IMAGING — MR MRI LEFT KNEE WITHOUT CONTRAST
5 of 6 series · 24 of 40 positions shown · IV contrast (gadolinium)
Comparison: 07/18/2020 [facility] radiographs

MRI LEFT KNEE WITHOUT CONTRAST, 08/02/2020 [DATE]: 
CLINICAL INDICATION: Left knee pain.
TECHNIQUE: Multiplanar, multiecho position MR images of the knee were performed 
without intravenous gadolinium enhancement.

[Series 101: survey_fullfov_transversal · axial · 10.0mm · 1.84mm/px · z∈[-51,+51]mm · 3 of 7 slices shown]
[im 1/7]
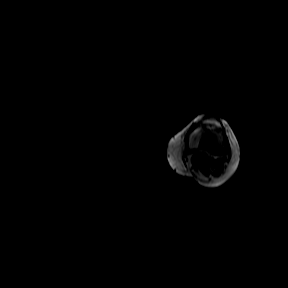
[im 4/7]
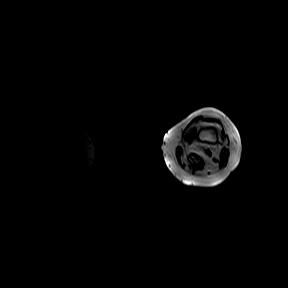
[im 7/7]
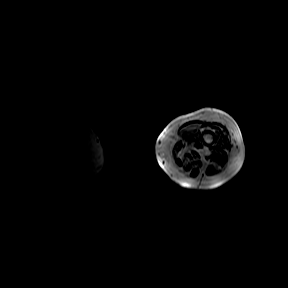

[Series 301: survey_ · axial · 10.0mm · 1.17mm/px · z∈[-20,+149]mm · 2 of 9 slices shown]
[im 1/9]
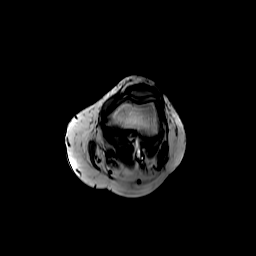
[im 9/9]
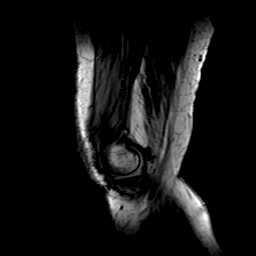

[Series 401: (person_name)_(person_name)_(person_name) · axial · 3.0mm · 0.37mm/px · z∈[-124,+34]mm · 8 of 45 slices shown]
[im 1/45]
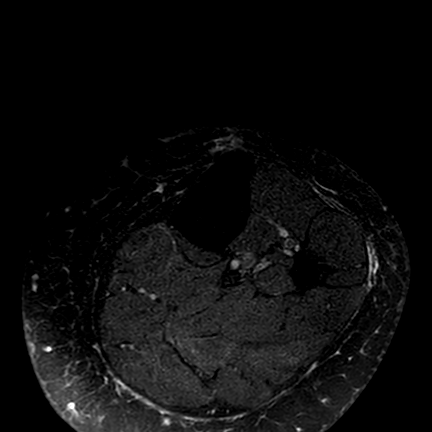
[im 5/45]
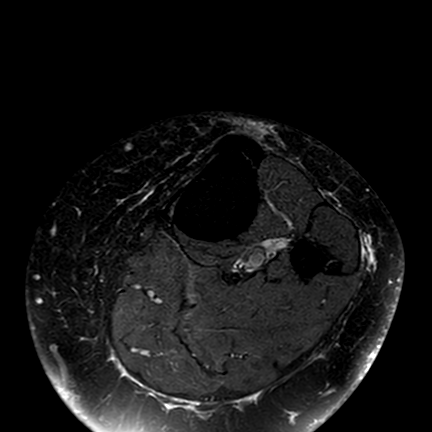
[im 15/45]
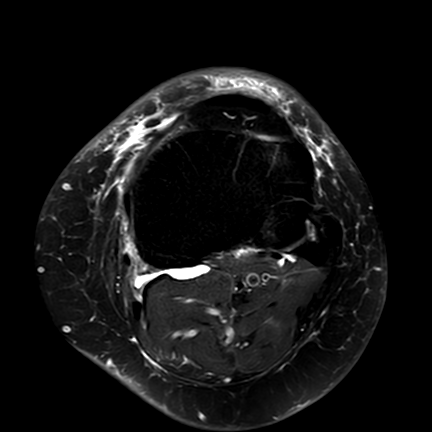
[im 20/45]
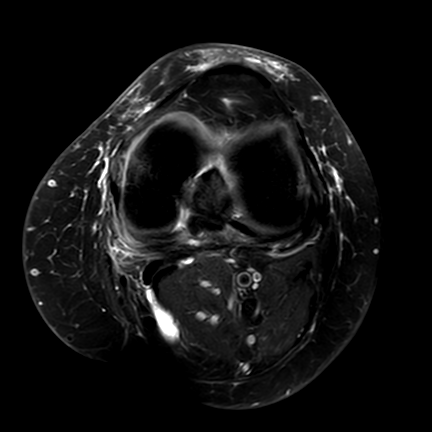
[im 25/45]
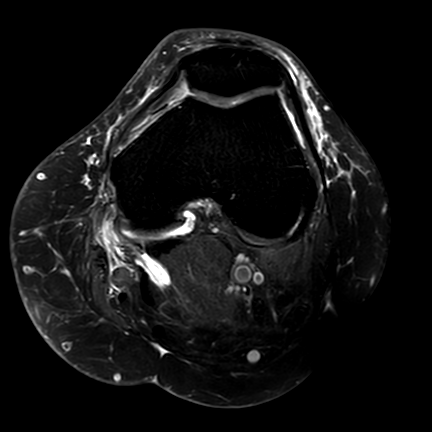
[im 30/45]
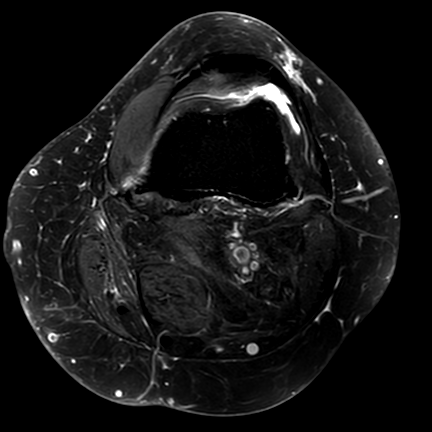
[im 40/45]
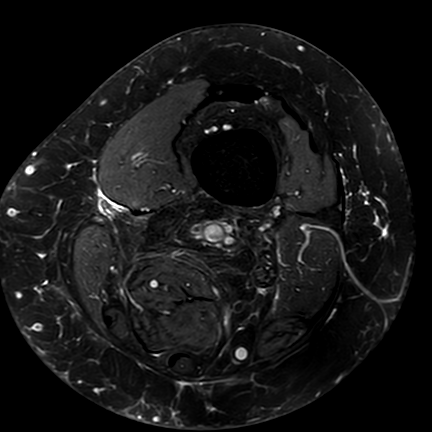
[im 45/45]
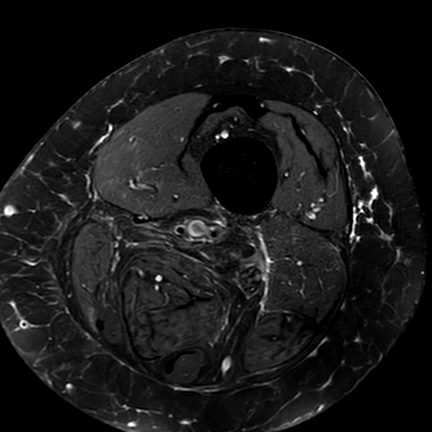

[Series 501: pd_fs_sag fh · sagittal · 3.0mm · 0.31mm/px · 7 of 32 slices shown]
[im 1/32]
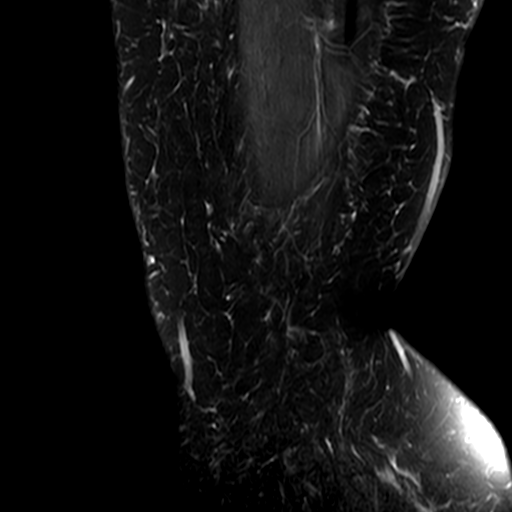
[im 6/32]
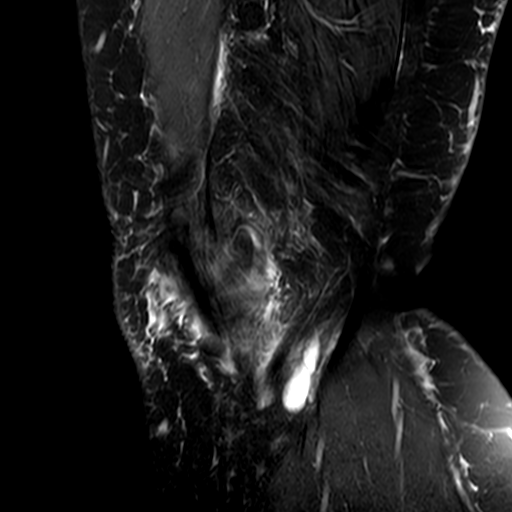
[im 11/32]
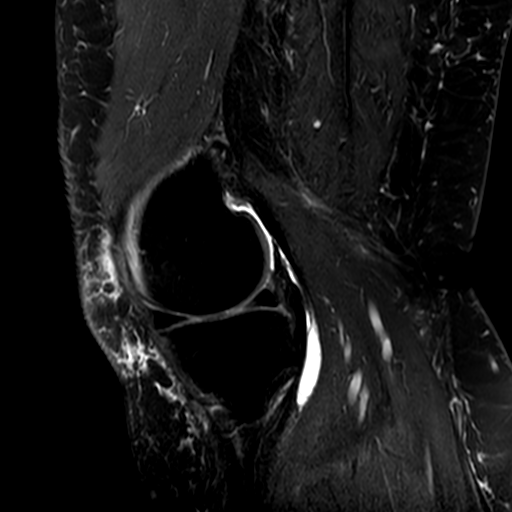
[im 16/32]
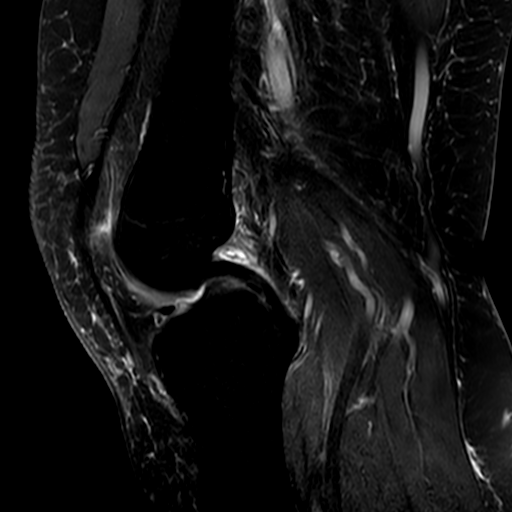
[im 21/32]
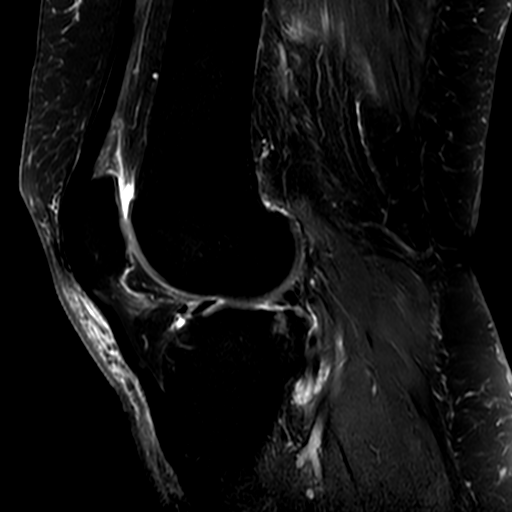
[im 26/32]
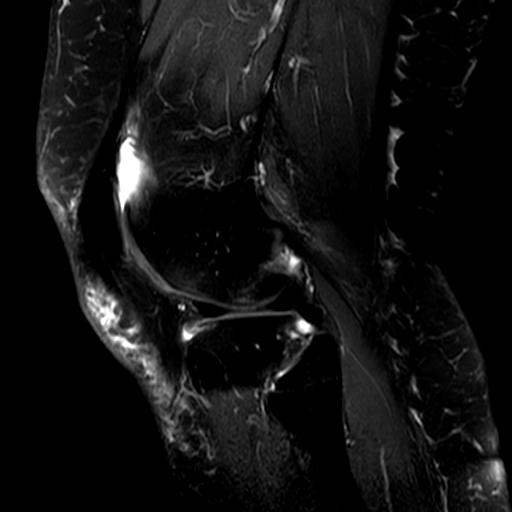
[im 32/32]
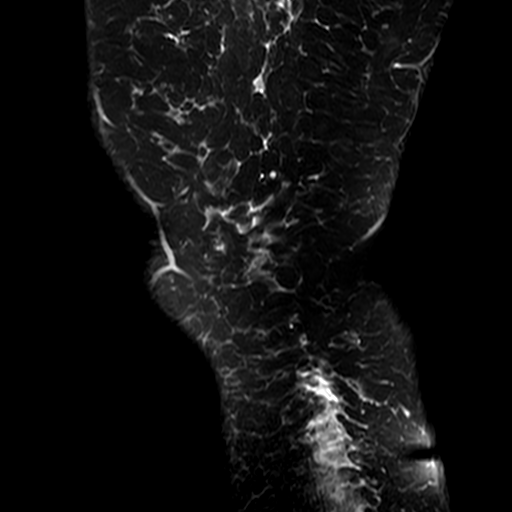

[Series 601: t1_cor · coronal · 3.0mm · 0.31mm/px · 4 of 40 slices shown]
[im 1/40]
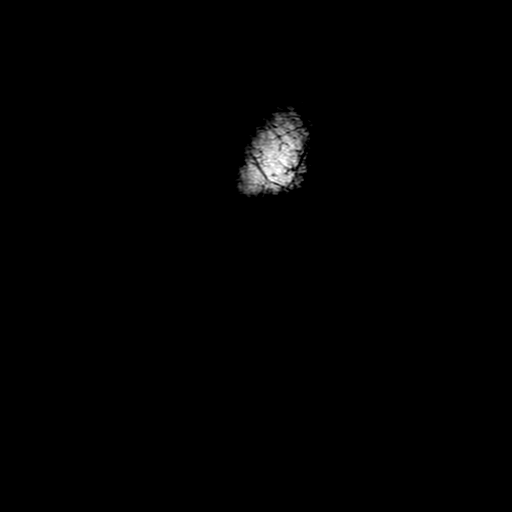
[im 5/40]
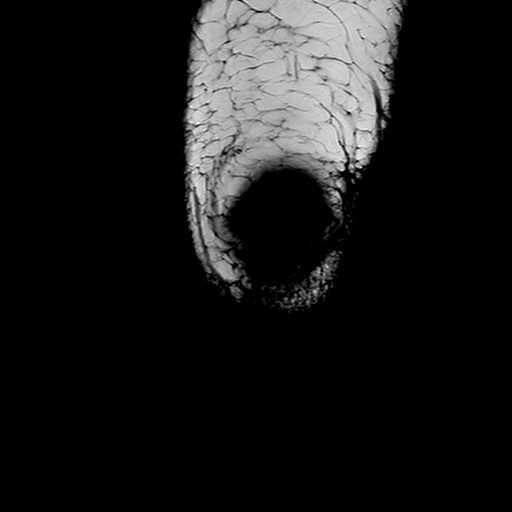
[im 10/40]
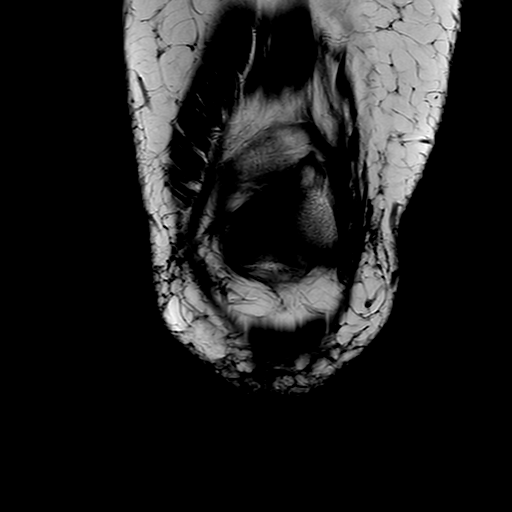
[im 15/40]
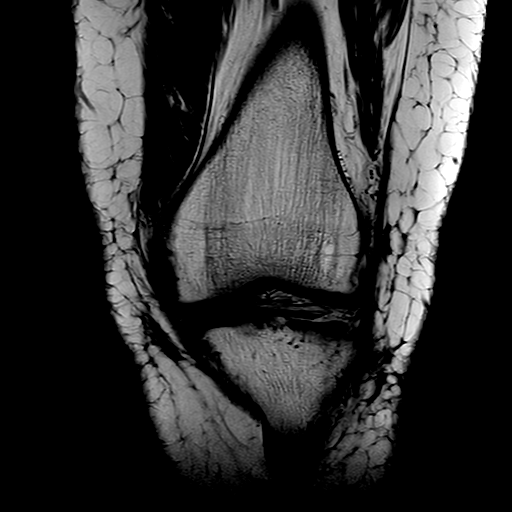

[24 of 40 positions shown; findings below may reference images not displayed]

FINDINGS: MEDIAL COMPARTMENT: Radial tear of the meniscal body and horizontal tears of the 
anterior and posterior horns. Small foci of grade III chondromalacia. Small 
marginal and central osteophytes. 
LATERAL COMPARTMENT: Horizontal tear of the body of the lateral meniscus. Up to 
grade IV chondromalacia of the posterior lateral tibial plateau with mild 
subcortical cystic change. Small marginal osteophytes. 
PATELLOFEMORAL COMPARTMENT: The patella is centrally located. Up to grade II 
chondromalacia. 
TIBIOFIBULAR COMPARTMENT: Negative. 
LIGAMENTS: The anterior cruciate ligament is intact. The posterior cruciate 
ligament is intact. The medial collateral ligament and lateral collateral 
ligaments are preserved. 
EXTENSOR MECHANISM: The quadriceps and patellar tendon are preserved. The medial 
and lateral retinacula are intact. 
POSTEROMEDIAL CORNER: The semimembranosus and pes anserine tendons are 
preserved. The posterior oblique ligament and posterior medial joint capsule are 
intact. 
POSTEROLATERAL CORNER: The popliteal tendon and popliteofibular ligament are 
intact. The biceps femoris is negative. 
BONES: Normal bone marrow signal intensity. No fracture or contusion or stress 
response. 
ADDITIONAL FINDINGS: No knee joint effusion. 2.0 x 0.6 x 6.9 cm popliteal cyst. 
The musculature is symmetric without mass, signal abnormality or atrophy. 
Neurovascular bundles are negative. Mild subcutaneous soft tissue swelling.
IMPRESSION: 1.  Radial tear of the meniscal body and horizontal tears of the anterior and 
posterior horns. 
2.  Horizontal tear of the body of the lateral meniscus. 
3.  Moderate lateral compartment and mild medial and patellofemoral joint 
degenerative change. 
4.  Small popliteal cyst and mild soft tissue swelling.

## 2021-11-07 IMAGING — MR MRI CERVICAL SPINE WITHOUT CONTRAST
4 of 6 series · 24 of 48 positions shown · IV contrast (gadolinium)
Comparison: Cervical spine x-ray October 20, 2021.

________________________________________________________________________________________________ 
MRI CERVICAL SPINE WITHOUT CONTRAST, 11/07/2021 [DATE]: 
CLINICAL INDICATION: Radiculopathy, occipito-atlanto-axial region. 
Neck pain radiating to the right side of the neck.
TECHNIQUE: Multiplanar, multiecho position MR images of the cervical spine were 
performed without intravenous gadolinium enhancement. Patient was scanned on a 
1.5T magnet.

[Series 101: survey* · axial · 10.0mm · 1.56mm/px · z∈[-30,+199]mm · 7 of 15 slices shown]
[im 1/15]
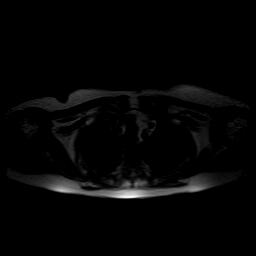
[im 3/15]
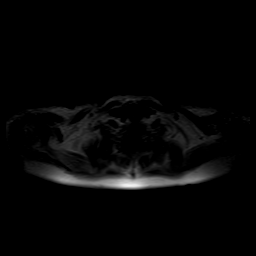
[im 5/15]
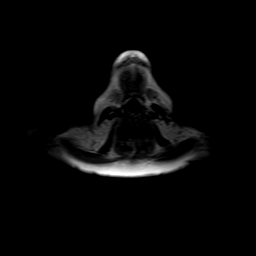
[im 8/15]
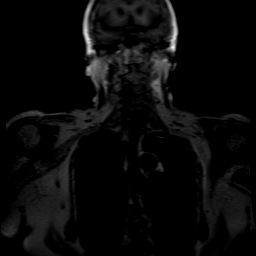
[im 10/15]
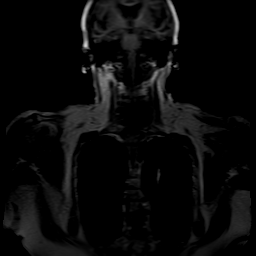
[im 12/15]
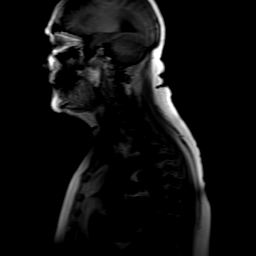
[im 15/15]
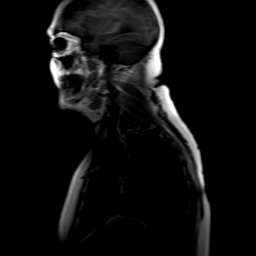

[Series 201: t2w_cor-surv · coronal · 5.0mm · 0.85mm/px · 3 of 7 slices shown]
[im 1/7]
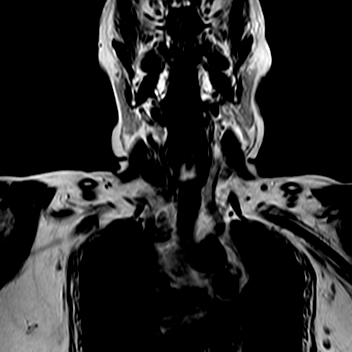
[im 4/7]
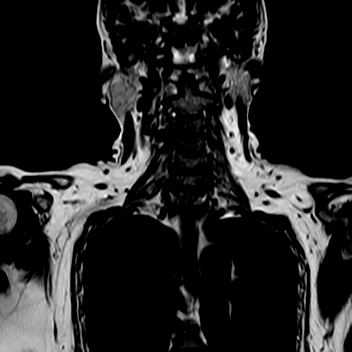
[im 7/7]
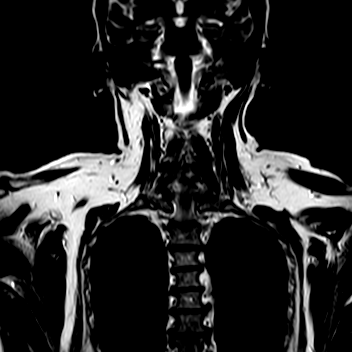

[Series 401: t2w_mv_xd_sag · sagittal · 3.0mm · 0.30mm/px · 7 of 15 slices shown]
[im 1/15]
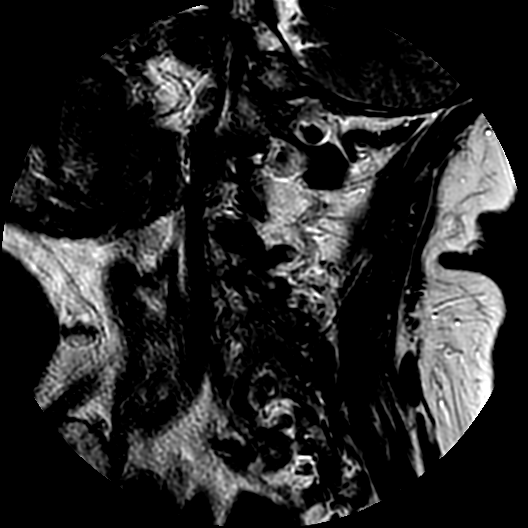
[im 3/15]
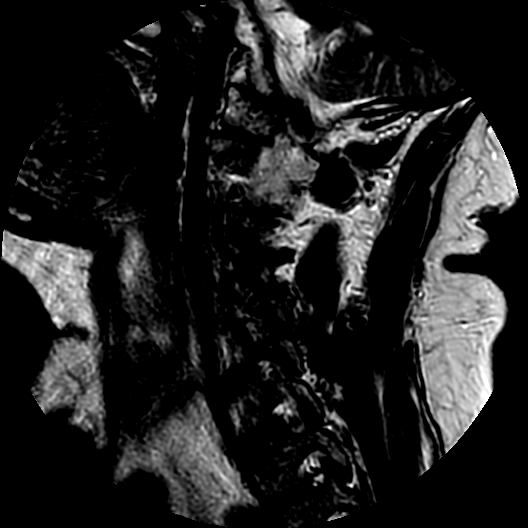
[im 5/15]
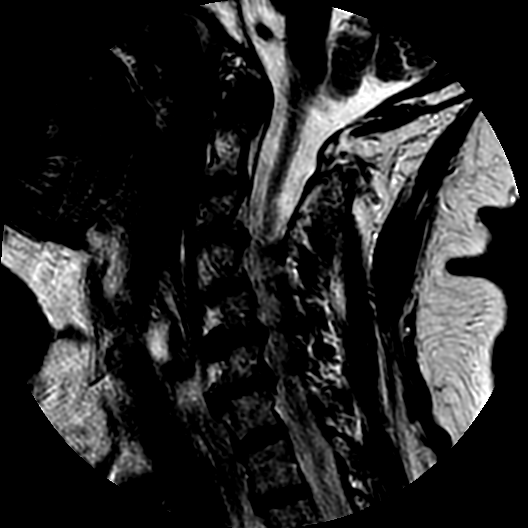
[im 8/15]
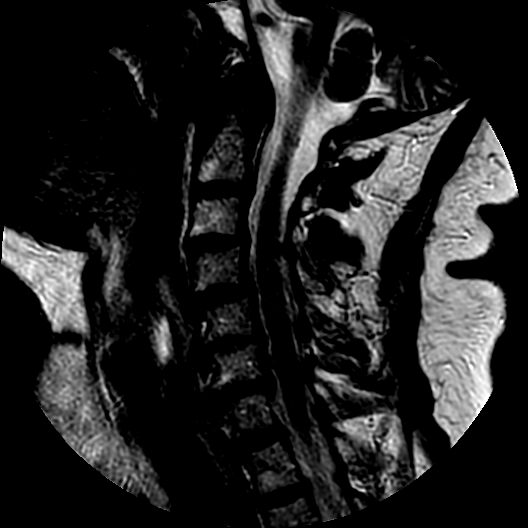
[im 10/15]
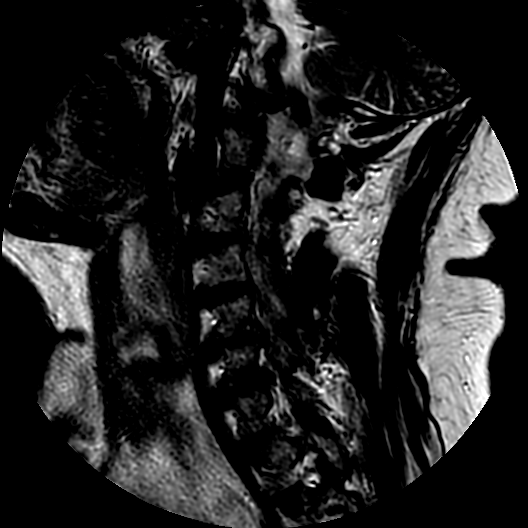
[im 12/15]
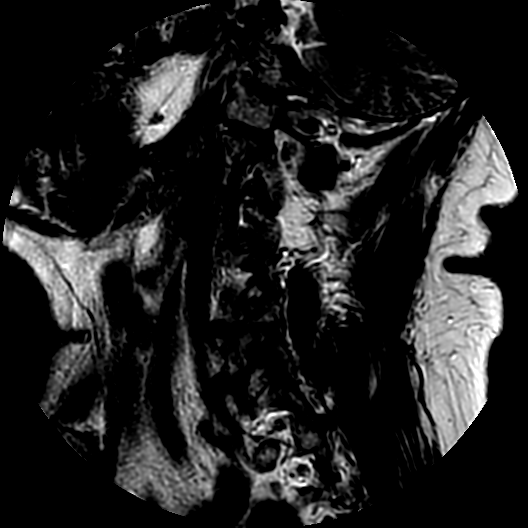
[im 15/15]
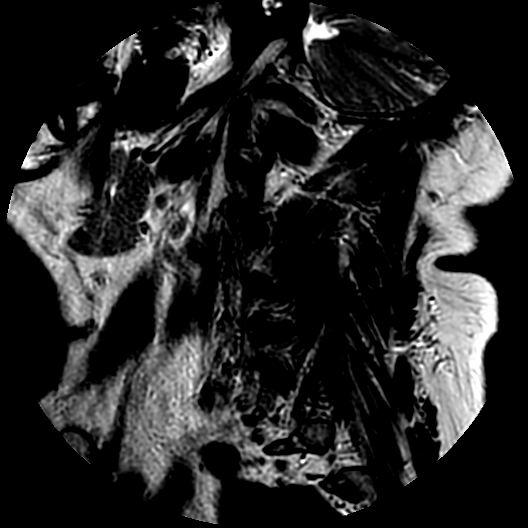

[Series 501: t1_sag · sagittal · 3.0mm · 0.32mm/px · 7 of 15 slices shown]
[im 1/15]
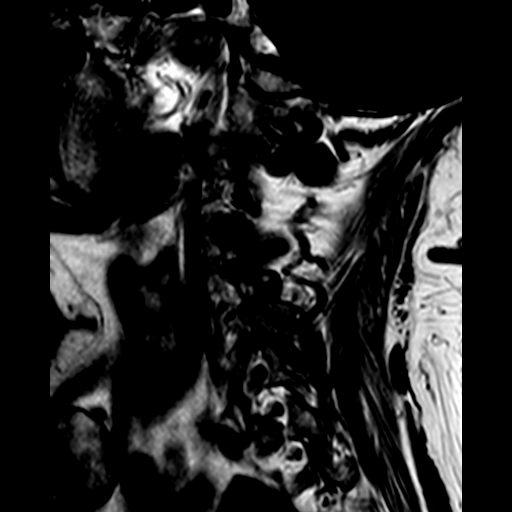
[im 3/15]
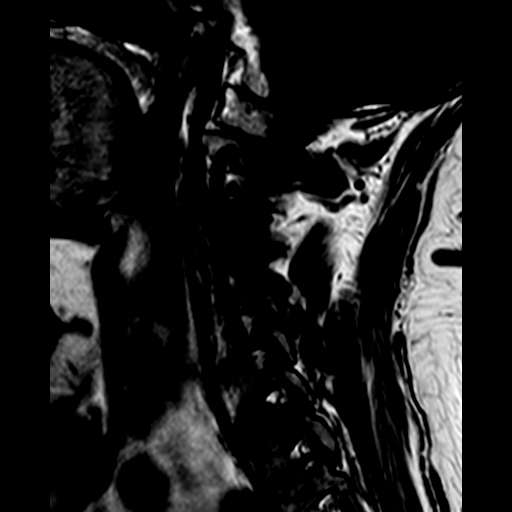
[im 5/15]
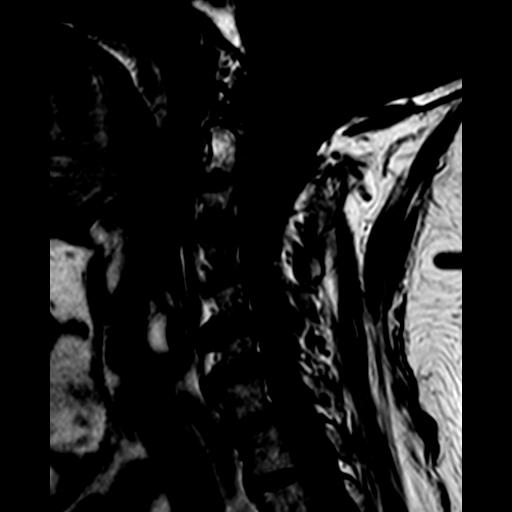
[im 8/15]
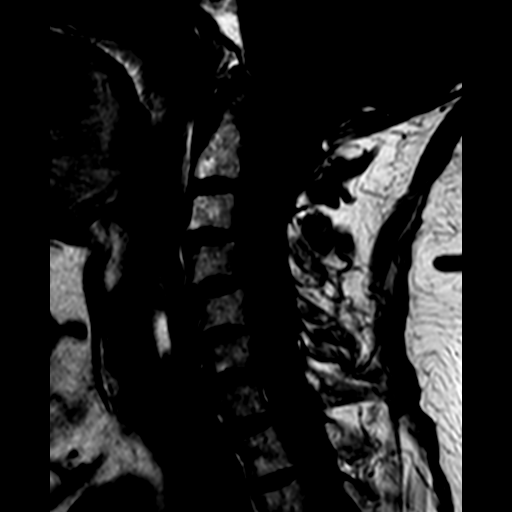
[im 10/15]
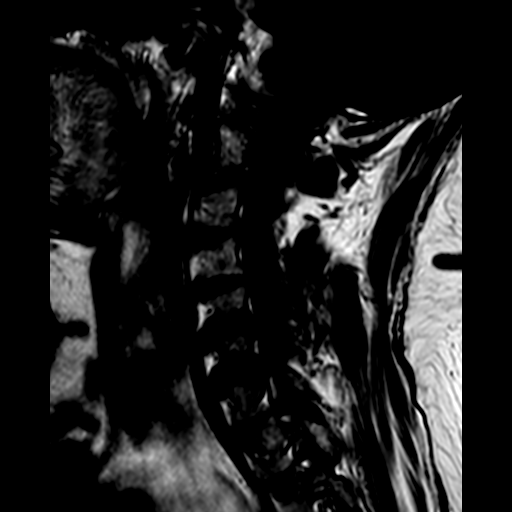
[im 12/15]
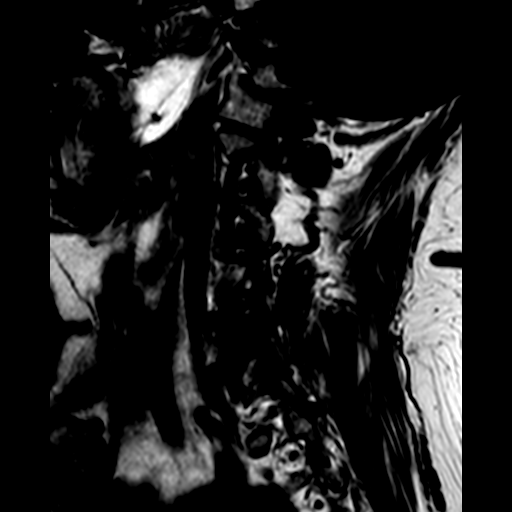
[im 15/15]
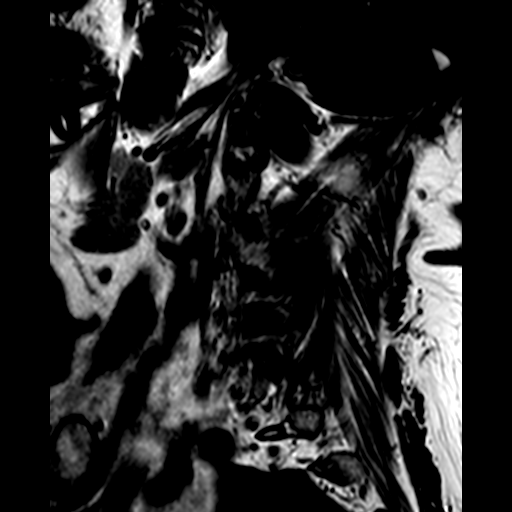

[24 of 48 positions shown; findings below may reference images not displayed]

FINDINGS: -------------------------------------------------------------------------------- 
----------------- 
GENERAL: Minimal levoconvex cervical scoliosis. 2 mm anterolisthesis C4 on C5 
with otherwise anatomic sagittal alignment. Vertebral body height preserved. No 
fracture. No marrow infiltrative process. There is prominent marrow edema 
involving the right C3-C4 facet and perifacet soft tissues which may indicate 
active facet arthritis. No significant facet joint effusion. No abnormal cord 
signal. No other paraspinous abnormality. 
-------------------------------------------------------------------------------- 
---------------- 
SEGMENTAL: 
CRANIOCERVICAL JUNCTION: No significant stenosis. 
C2-C3: Normal disc height. No herniation. Normal facets. No spinal canal or 
neural foraminal stenosis. 
C3-C4: Mild loss of disc height. Minimal annular bulge. Central canal and left 
foramen are patent. Right foramen is mild to moderately narrowed with 
right-sided uncinate spurring. Facet arthropathy bilaterally. Posterior 
ligamentous hypertrophy. 
C4-C5: Normal disc height. Patent canal and left foramen. Right foramen is mild 
to moderately narrowed by combination of facet arthropathy and right-sided 
uncinate spurring. Right greater than left facet arthropathy. 
C5-C6: Moderate loss of disc height. Posterior ligamentous hypertrophy. Mild to 
moderate canal stenosis. Slight flattening of ventral cord margin. Moderate to 
moderately severe bilateral foraminal narrowing. Bilateral uncinate spurring. 
Facet arthropathy. 
C6-C7: Moderate loss of disc height. Loss of disc signal. Mild canal stenosis. 
Patent right foramen. Left foramen is severely narrowed related to left-sided 
uncinate spurring and posterior osteophytic ridging. Facet arthropathy. 
Correlate for involvement of exiting left C7 nerve root. 
C7-T1: Normal disc height. No herniation. Normal facets. No spinal canal or 
neural foraminal stenosis. 
Visualized upper thoracic segments are unremarkable. 
-------------------------------------------------------------------------------- 
---------------
IMPRESSION: Prominent marrow edema involving the right C3-C4 facet and perifacet soft 
tissues may indicate active facet arthritis at this level. 
Mild to moderate right foraminal narrowing C3-C4. 
C4-C5, mild to moderate right foraminal narrowing. 
C5-C6, mild to moderate canal stenosis with moderate to moderately severe 
bilateral foraminal narrowing. 
C6-C7, severe left foraminal narrowing could affect the exiting left C7 nerve 
root.
